# Patient Record
Sex: Female | Born: 1980 | Race: White | Hispanic: No | Marital: Single | Smoking: Current every day smoker
Health system: Southern US, Community
[De-identification: ages and names within clinical notes are randomized; demographics above are authoritative.]

## PROBLEM LIST (undated history)

## (undated) DIAGNOSIS — O42919 Preterm premature rupture of membranes, unspecified as to length of time between rupture and onset of labor, unspecified trimester: Secondary | ICD-10-CM

## (undated) DIAGNOSIS — Z9889 Other specified postprocedural states: Secondary | ICD-10-CM

## (undated) DIAGNOSIS — E039 Hypothyroidism, unspecified: Secondary | ICD-10-CM

## (undated) HISTORY — PX: GASTRIC BYPASS: SHX52

## (undated) HISTORY — PX: EYE SURGERY: SHX253

---

## 2007-01-21 ENCOUNTER — Emergency Department (HOSPITAL_COMMUNITY): Admission: EM | Admit: 2007-01-21 | Discharge: 2007-01-22 | Payer: Self-pay | Admitting: Emergency Medicine

## 2008-02-07 ENCOUNTER — Emergency Department (HOSPITAL_COMMUNITY): Admission: EM | Admit: 2008-02-07 | Discharge: 2008-02-07 | Payer: Self-pay | Admitting: Emergency Medicine

## 2008-08-11 ENCOUNTER — Emergency Department (HOSPITAL_COMMUNITY): Admission: EM | Admit: 2008-08-11 | Discharge: 2008-08-12 | Payer: Self-pay | Admitting: Emergency Medicine

## 2010-01-05 IMAGING — CT CT ABDOMEN W/O CM
2 of 4 series · 14 of 32 positions shown, 19 images · non-contrast
Comparison: None available.

CT ABDOMEN

CLINICAL DATA: Abdominal pain.

CT ABDOMEN AND PELVIS WITHOUT CONTRAST
TECHNIQUE: Multidetector CT imaging of the abdomen and pelvis was
performed following the standard protocol without intravenous
contrast.

[Series 2: routine abdomen · axial · 0.80mm/px · z∈[-418,-72]mm · 7 of 93 slices shown, 12 images]
[im 12/93  soft-tissue]
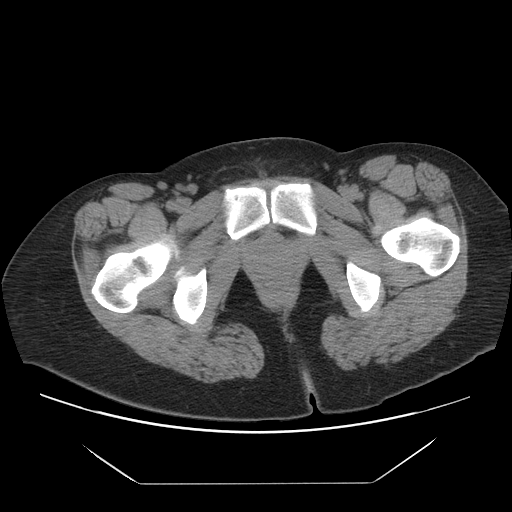
[im 12/93  bone]
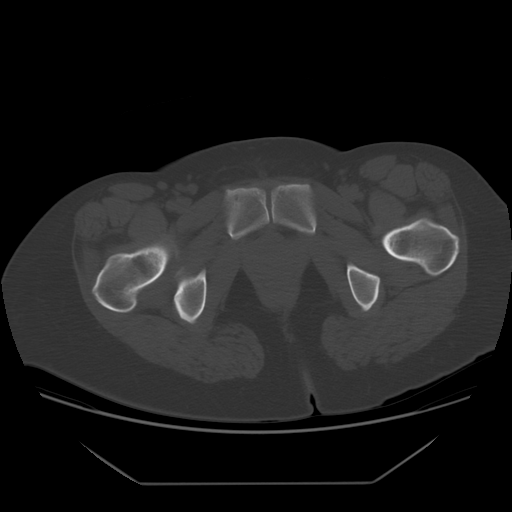
[im 24/93  soft-tissue]
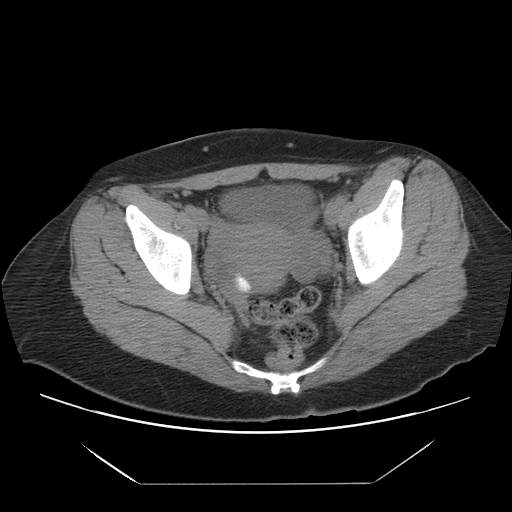
[im 35/93  soft-tissue]
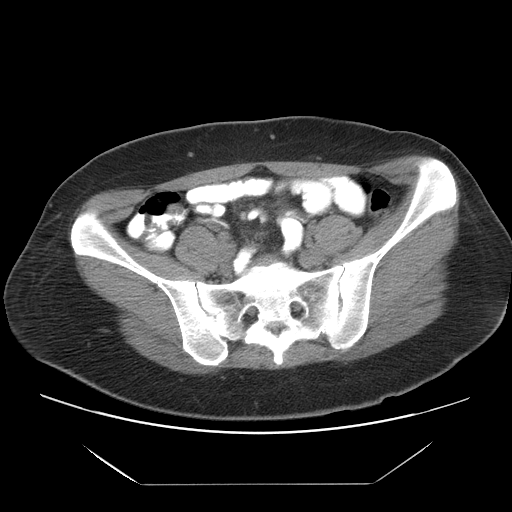
[im 47/93  soft-tissue]
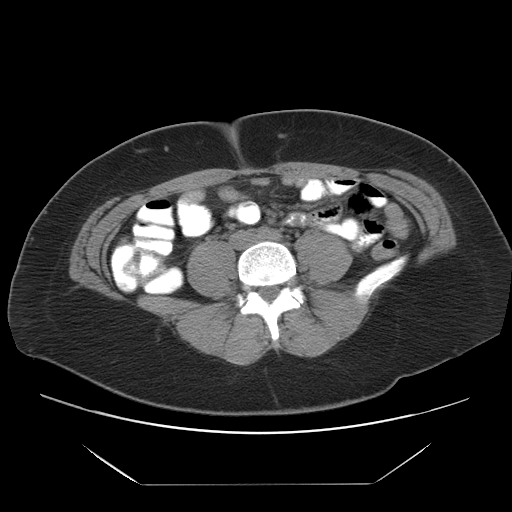
[im 47/93  lung]
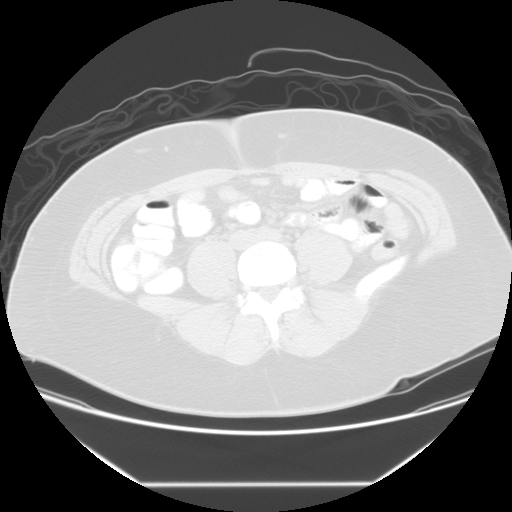
[im 58/93  soft-tissue]
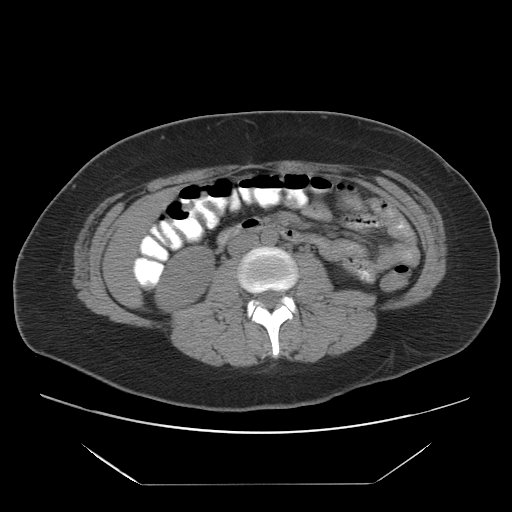
[im 58/93  lung]
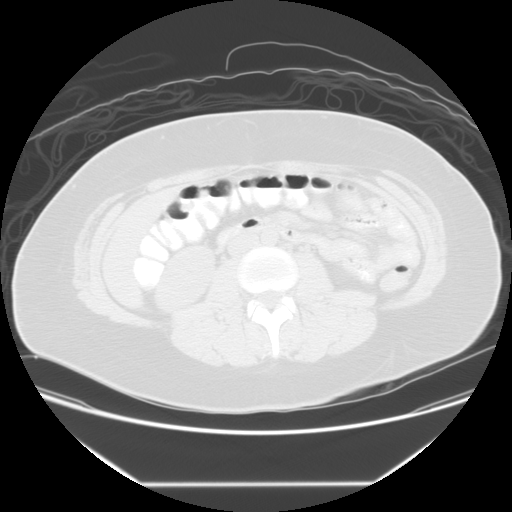
[im 70/93  soft-tissue]
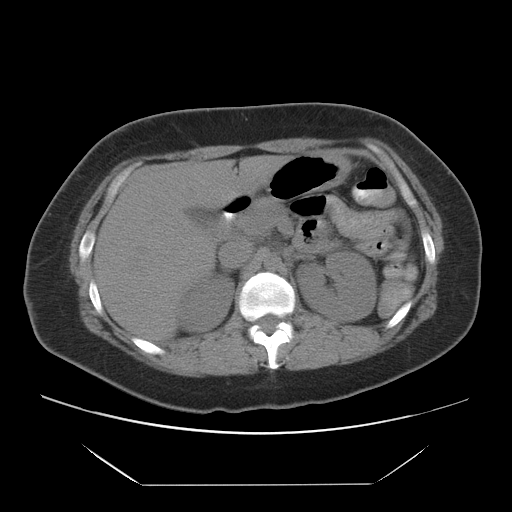
[im 70/93  lung]
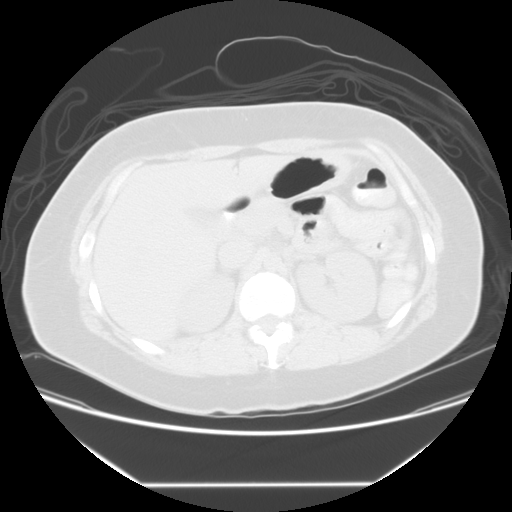
[im 81/93  soft-tissue]
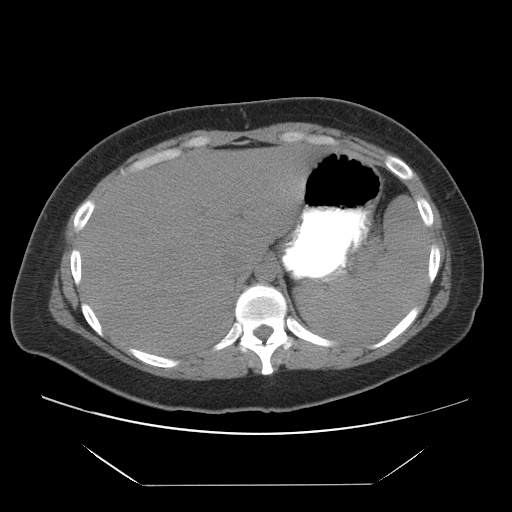
[im 81/93  lung]
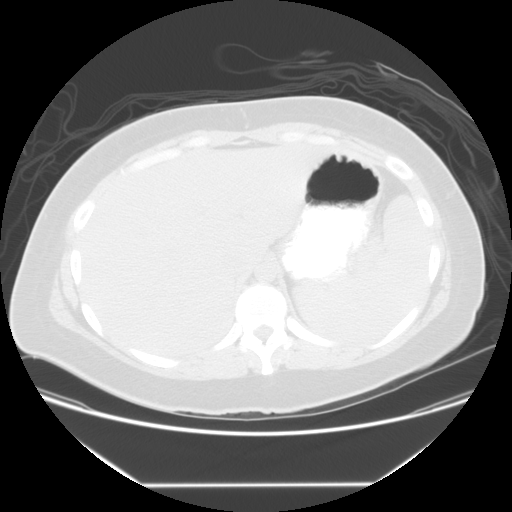

[Series 400: sag · sagittal · 0.90mm/px · 7 of 115 slices shown]
[im 12/115  soft-tissue]
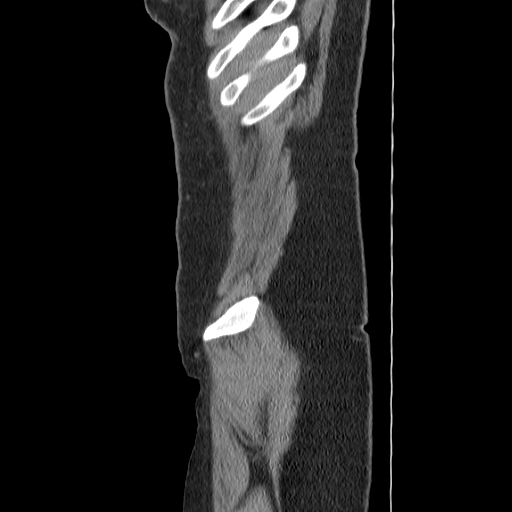
[im 23/115  soft-tissue]
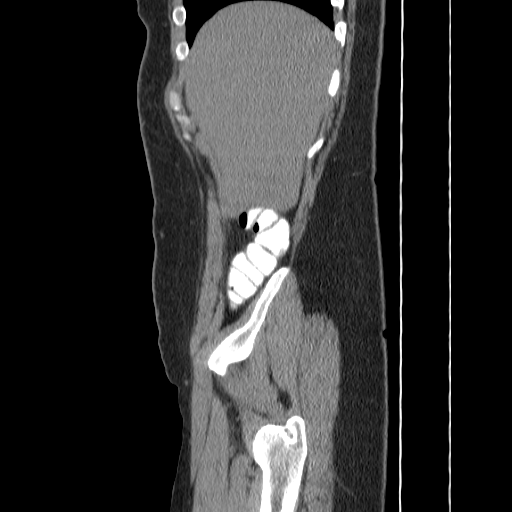
[im 35/115  soft-tissue]
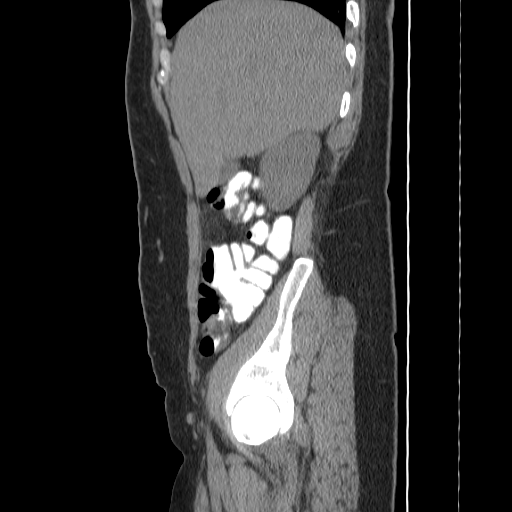
[im 46/115  soft-tissue]
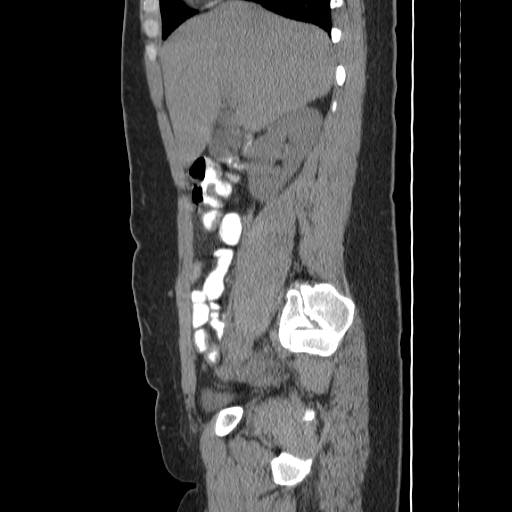
[im 69/115  soft-tissue]
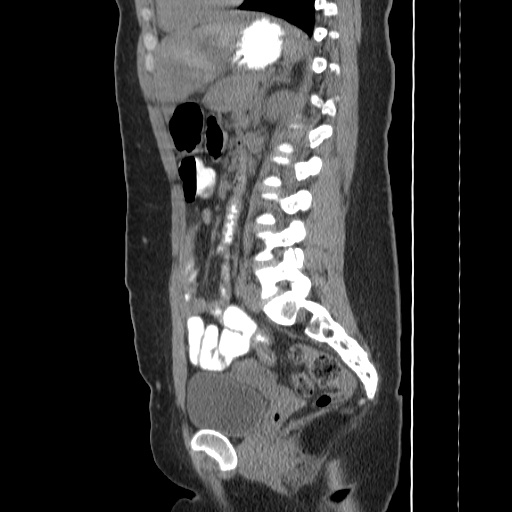
[im 80/115  soft-tissue]
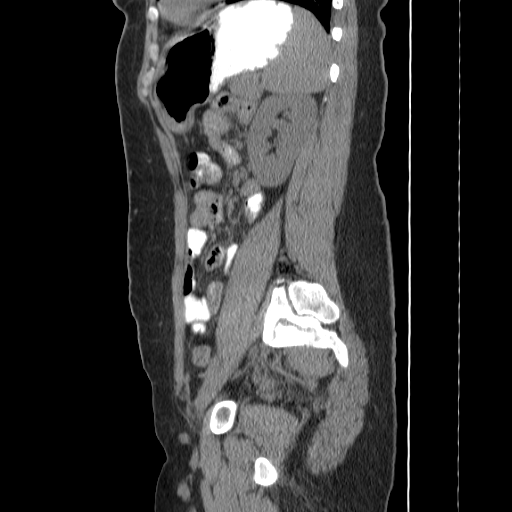
[im 92/115  soft-tissue]
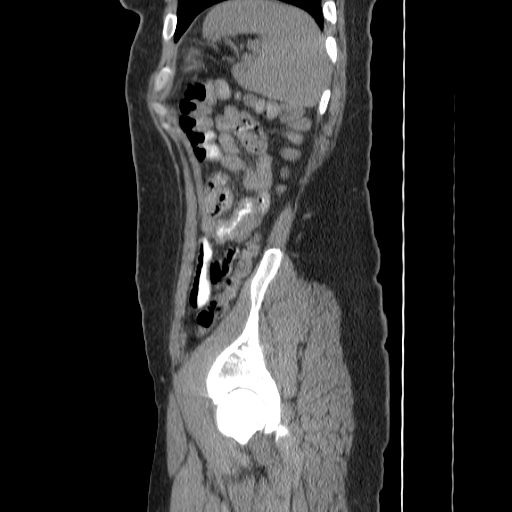

[14 of 32 positions shown; findings below may reference images not displayed]

FINDINGS: The lung bases are clear.  There is no pleural or
pericardial effusion.  The gallbladder, liver, spleen, pancreas,
kidneys and adrenal glands all have a normal uninfused appearance.
There is no abdominal lymphadenopathy or fluid.  Stomach and small
bowel appear normal.  There is no focal bony abnormality.
IMPRESSION: Negative abdomen CT scan.

CT PELVIS
FINDINGS: Uterus and adnexa appear normal.  No pelvic fluid or
lymphadenopathy.  The colon appears normal.  Appendix is visualized
and unremarkable.  No focal bony abnormality.
IMPRESSION: Negative pelvic CT.

## 2010-04-02 ENCOUNTER — Emergency Department (HOSPITAL_COMMUNITY): Admission: EM | Admit: 2010-04-02 | Discharge: 2010-04-02 | Payer: Self-pay | Admitting: Emergency Medicine

## 2010-11-02 LAB — URINALYSIS, ROUTINE W REFLEX MICROSCOPIC
Bilirubin Urine: NEGATIVE
Glucose, UA: NEGATIVE mg/dL
Hgb urine dipstick: NEGATIVE
Ketones, ur: NEGATIVE mg/dL
Protein, ur: NEGATIVE mg/dL
Urobilinogen, UA: 1 mg/dL (ref 0.0–1.0)

## 2010-11-02 LAB — PREGNANCY, URINE: Preg Test, Ur: NEGATIVE

## 2011-05-24 LAB — DIFFERENTIAL
Basophils Relative: 1 % (ref 0–1)
Eosinophils Absolute: 0 10*3/uL (ref 0.0–0.7)
Eosinophils Relative: 0 % (ref 0–5)
Lymphocytes Relative: 4 % — ABNORMAL LOW (ref 12–46)
Lymphs Abs: 0.9 10*3/uL (ref 0.7–4.0)
Monocytes Absolute: 0.8 10*3/uL (ref 0.1–1.0)
Neutro Abs: 22.1 10*3/uL — ABNORMAL HIGH (ref 1.7–7.7)

## 2011-05-24 LAB — WET PREP, GENITAL
Trich, Wet Prep: NONE SEEN
Yeast Wet Prep HPF POC: NONE SEEN

## 2011-05-24 LAB — URINE CULTURE: Colony Count: >=100000

## 2011-05-24 LAB — CBC
HCT: 45.1 % (ref 36.0–46.0)
Hemoglobin: 15.2 g/dL — ABNORMAL HIGH (ref 12.0–15.0)
WBC: 23.9 10*3/uL — ABNORMAL HIGH (ref 4.0–10.5)

## 2011-05-24 LAB — HEPATIC FUNCTION PANEL
Albumin: 3.7 g/dL (ref 3.5–5.2)
Total Bilirubin: 2 mg/dL — ABNORMAL HIGH (ref 0.3–1.2)

## 2011-05-24 LAB — URINALYSIS, ROUTINE W REFLEX MICROSCOPIC
Ketones, ur: 15 mg/dL — AB
Nitrite: NEGATIVE
Protein, ur: 30 mg/dL — AB
pH: 7.5 (ref 5.0–8.0)

## 2011-05-24 LAB — POCT I-STAT, CHEM 8
HCT: 51 % — ABNORMAL HIGH (ref 36.0–46.0)
Hemoglobin: 17.3 g/dL — ABNORMAL HIGH (ref 12.0–15.0)
Potassium: 4.5 mEq/L (ref 3.5–5.1)
Sodium: 138 mEq/L (ref 135–145)

## 2011-05-24 LAB — LIPASE, BLOOD: Lipase: 19 U/L (ref 11–59)

## 2011-05-24 LAB — URINE MICROSCOPIC-ADD ON

## 2011-05-24 LAB — POCT PREGNANCY, URINE: Preg Test, Ur: NEGATIVE

## 2011-05-24 LAB — GC/CHLAMYDIA PROBE AMP, GENITAL: Chlamydia, DNA Probe: POSITIVE — AB

## 2011-06-06 LAB — GC/CHLAMYDIA PROBE AMP, GENITAL: Chlamydia, DNA Probe: NEGATIVE

## 2011-06-06 LAB — URINE MICROSCOPIC-ADD ON

## 2011-06-06 LAB — URINALYSIS, ROUTINE W REFLEX MICROSCOPIC
Bilirubin Urine: NEGATIVE
Hgb urine dipstick: NEGATIVE
Nitrite: NEGATIVE
Specific Gravity, Urine: 1.026
pH: 6

## 2011-06-06 LAB — WET PREP, GENITAL
WBC, Wet Prep HPF POC: NONE SEEN
Yeast Wet Prep HPF POC: NONE SEEN

## 2011-06-06 LAB — RPR: RPR Ser Ql: REACTIVE — AB

## 2013-12-31 ENCOUNTER — Other Ambulatory Visit (HOSPITAL_COMMUNITY)
Admission: RE | Admit: 2013-12-31 | Discharge: 2013-12-31 | Disposition: A | Discharge status: Home / Self Care | Payer: BC Managed Care – PPO | Source: Ambulatory Visit | Attending: Obstetrics & Gynecology | Admitting: Obstetrics & Gynecology

## 2013-12-31 ENCOUNTER — Other Ambulatory Visit: Payer: Self-pay | Admitting: Obstetrics & Gynecology

## 2013-12-31 DIAGNOSIS — R87810 Cervical high risk human papillomavirus (HPV) DNA test positive: Secondary | ICD-10-CM | POA: Insufficient documentation

## 2013-12-31 DIAGNOSIS — Z1151 Encounter for screening for human papillomavirus (HPV): Secondary | ICD-10-CM | POA: Insufficient documentation

## 2013-12-31 DIAGNOSIS — Z124 Encounter for screening for malignant neoplasm of cervix: Secondary | ICD-10-CM | POA: Insufficient documentation

## 2014-01-21 ENCOUNTER — Other Ambulatory Visit: Payer: Self-pay | Admitting: Obstetrics & Gynecology

## 2014-02-14 ENCOUNTER — Other Ambulatory Visit: Payer: Self-pay | Admitting: Obstetrics & Gynecology

## 2014-09-30 ENCOUNTER — Other Ambulatory Visit (HOSPITAL_COMMUNITY)
Admission: RE | Admit: 2014-09-30 | Discharge: 2014-09-30 | Disposition: A | Discharge status: Home / Self Care | Payer: Medicaid Other | Source: Ambulatory Visit | Attending: Obstetrics & Gynecology | Admitting: Obstetrics & Gynecology

## 2014-09-30 ENCOUNTER — Other Ambulatory Visit: Payer: Self-pay | Admitting: Obstetrics & Gynecology

## 2014-09-30 DIAGNOSIS — Z01419 Encounter for gynecological examination (general) (routine) without abnormal findings: Secondary | ICD-10-CM | POA: Diagnosis present

## 2014-09-30 DIAGNOSIS — Z113 Encounter for screening for infections with a predominantly sexual mode of transmission: Secondary | ICD-10-CM | POA: Diagnosis present

## 2014-09-30 LAB — OB RESULTS CONSOLE GC/CHLAMYDIA
Chlamydia: NEGATIVE
GC PROBE AMP, GENITAL: NEGATIVE

## 2014-10-03 LAB — CYTOLOGY - PAP

## 2014-10-07 LAB — OB RESULTS CONSOLE RUBELLA ANTIBODY, IGM: Rubella: IMMUNE

## 2014-10-07 LAB — OB RESULTS CONSOLE GC/CHLAMYDIA
Chlamydia: NEGATIVE
Gonorrhea: NEGATIVE

## 2014-10-07 LAB — OB RESULTS CONSOLE HEPATITIS B SURFACE ANTIGEN: Hepatitis B Surface Ag: NEGATIVE

## 2014-10-07 LAB — OB RESULTS CONSOLE RPR: RPR: NONREACTIVE

## 2015-01-26 ENCOUNTER — Inpatient Hospital Stay (HOSPITAL_COMMUNITY): Payer: Medicaid Other

## 2015-01-26 ENCOUNTER — Inpatient Hospital Stay (HOSPITAL_COMMUNITY)
Admission: AD | Admit: 2015-01-26 | Discharge: 2015-02-05 | DRG: 765 | Disposition: A | Discharge status: Home / Self Care | Payer: Medicaid Other | Source: Ambulatory Visit | Attending: Obstetrics & Gynecology | Admitting: Obstetrics & Gynecology

## 2015-01-26 ENCOUNTER — Encounter (HOSPITAL_COMMUNITY): Payer: Self-pay

## 2015-01-26 ENCOUNTER — Ambulatory Visit (HOSPITAL_COMMUNITY): Payer: Medicaid Other

## 2015-01-26 DIAGNOSIS — Z87891 Personal history of nicotine dependence: Secondary | ICD-10-CM | POA: Diagnosis not present

## 2015-01-26 DIAGNOSIS — O42913 Preterm premature rupture of membranes, unspecified as to length of time between rupture and onset of labor, third trimester: Secondary | ICD-10-CM | POA: Diagnosis not present

## 2015-01-26 DIAGNOSIS — O99214 Obesity complicating childbirth: Secondary | ICD-10-CM | POA: Diagnosis present

## 2015-01-26 DIAGNOSIS — O322XX0 Maternal care for transverse and oblique lie, not applicable or unspecified: Secondary | ICD-10-CM | POA: Diagnosis present

## 2015-01-26 DIAGNOSIS — E669 Obesity, unspecified: Secondary | ICD-10-CM | POA: Diagnosis present

## 2015-01-26 DIAGNOSIS — Z3A28 28 weeks gestation of pregnancy: Secondary | ICD-10-CM | POA: Diagnosis present

## 2015-01-26 DIAGNOSIS — Z882 Allergy status to sulfonamides status: Secondary | ICD-10-CM

## 2015-01-26 DIAGNOSIS — N879 Dysplasia of cervix uteri, unspecified: Secondary | ICD-10-CM | POA: Diagnosis present

## 2015-01-26 DIAGNOSIS — E039 Hypothyroidism, unspecified: Secondary | ICD-10-CM | POA: Diagnosis present

## 2015-01-26 DIAGNOSIS — Z6841 Body Mass Index (BMI) 40.0 and over, adult: Secondary | ICD-10-CM

## 2015-01-26 DIAGNOSIS — O42919 Preterm premature rupture of membranes, unspecified as to length of time between rupture and onset of labor, unspecified trimester: Secondary | ICD-10-CM | POA: Diagnosis present

## 2015-01-26 DIAGNOSIS — Z98891 History of uterine scar from previous surgery: Secondary | ICD-10-CM

## 2015-01-26 DIAGNOSIS — B9620 Unspecified Escherichia coli [E. coli] as the cause of diseases classified elsewhere: Secondary | ICD-10-CM | POA: Diagnosis not present

## 2015-01-26 DIAGNOSIS — O8620 Urinary tract infection following delivery, unspecified: Secondary | ICD-10-CM | POA: Diagnosis not present

## 2015-01-26 DIAGNOSIS — O99284 Endocrine, nutritional and metabolic diseases complicating childbirth: Secondary | ICD-10-CM | POA: Diagnosis present

## 2015-01-26 DIAGNOSIS — O09213 Supervision of pregnancy with history of pre-term labor, third trimester: Secondary | ICD-10-CM | POA: Diagnosis not present

## 2015-01-26 DIAGNOSIS — O4290 Premature rupture of membranes, unspecified as to length of time between rupture and onset of labor, unspecified weeks of gestation: Secondary | ICD-10-CM

## 2015-01-26 DIAGNOSIS — O9989 Other specified diseases and conditions complicating pregnancy, childbirth and the puerperium: Secondary | ICD-10-CM | POA: Diagnosis present

## 2015-01-26 HISTORY — DX: Other specified postprocedural states: Z98.890

## 2015-01-26 HISTORY — DX: Hypothyroidism, unspecified: E03.9

## 2015-01-26 LAB — CBC
HCT: 31.9 % — ABNORMAL LOW (ref 36.0–46.0)
Hemoglobin: 11.1 g/dL — ABNORMAL LOW (ref 12.0–15.0)
MCH: 29.4 pg (ref 26.0–34.0)
MCHC: 34.8 g/dL (ref 30.0–36.0)
MCV: 84.6 fL (ref 78.0–100.0)
PLATELETS: 170 10*3/uL (ref 150–400)
RBC: 3.77 MIL/uL — ABNORMAL LOW (ref 3.87–5.11)
RDW: 12.5 % (ref 11.5–15.5)
WBC: 10 10*3/uL (ref 4.0–10.5)

## 2015-01-26 LAB — ABO/RH: ABO/RH(D): AB POS

## 2015-01-26 LAB — OB RESULTS CONSOLE GBS: STREP GROUP B AG: NEGATIVE

## 2015-01-26 LAB — TYPE AND SCREEN
ABO/RH(D): AB POS
Antibody Screen: NEGATIVE

## 2015-01-26 LAB — GROUP B STREP BY PCR: Group B strep by PCR: NEGATIVE

## 2015-01-26 LAB — AMNISURE RUPTURE OF MEMBRANE (ROM) NOT AT ARMC: Amnisure ROM: POSITIVE

## 2015-01-26 LAB — POCT FERN TEST: POCT Fern Test: POSITIVE

## 2015-01-26 MED ORDER — CALCIUM CARBONATE ANTACID 500 MG PO CHEW
2.0000 | CHEWABLE_TABLET | ORAL | Status: DC | PRN
  Filled 2015-01-26: qty 2

## 2015-01-26 MED ORDER — ACETAMINOPHEN 325 MG PO TABS: 650.0000 mg | ORAL_TABLET | ORAL | Status: DC | PRN

## 2015-01-26 MED ORDER — AZITHROMYCIN 1 G PO PACK
1.0000 g | PACK | Freq: Once | ORAL | Status: AC
  Administered 2015-01-26: 1 g via ORAL
  Filled 2015-01-26: qty 1

## 2015-01-26 MED ORDER — DOCUSATE SODIUM 100 MG PO CAPS
100.0000 mg | ORAL_CAPSULE | Freq: Every day | ORAL | Status: DC
  Administered 2015-01-26 – 2015-02-02 (×8): 100 mg via ORAL
  Filled 2015-01-26 (×9): qty 1

## 2015-01-26 MED ORDER — BETAMETHASONE SOD PHOS & ACET 6 (3-3) MG/ML IJ SUSP: 12.0000 mg | INTRAMUSCULAR | Status: DC

## 2015-01-26 MED ORDER — LEVOTHYROXINE SODIUM 100 MCG PO TABS
100.0000 ug | ORAL_TABLET | Freq: Every day | ORAL | Status: DC
  Filled 2015-01-26: qty 1

## 2015-01-26 MED ORDER — BETAMETHASONE SOD PHOS & ACET 6 (3-3) MG/ML IJ SUSP
12.0000 mg | Freq: Once | INTRAMUSCULAR | Status: AC
  Administered 2015-01-27: 12 mg via INTRAMUSCULAR
  Filled 2015-01-26: qty 2

## 2015-01-26 MED ORDER — SODIUM CHLORIDE 0.9 % IV SOLN
2.0000 g | Freq: Four times a day (QID) | INTRAVENOUS | Status: AC
Start: 2015-01-26 — End: 2015-01-28
  Administered 2015-01-26 – 2015-01-28 (×8): 2 g via INTRAVENOUS
  Filled 2015-01-26 (×8): qty 2000

## 2015-01-26 MED ORDER — BETAMETHASONE SOD PHOS & ACET 6 (3-3) MG/ML IJ SUSP
12.0000 mg | Freq: Once | INTRAMUSCULAR | Status: AC
  Administered 2015-01-26: 12 mg via INTRAMUSCULAR
  Filled 2015-01-26: qty 2

## 2015-01-26 MED ORDER — SODIUM CHLORIDE 0.9 % IJ SOLN
3.0000 mL | INTRAMUSCULAR | Status: DC | PRN
  Administered 2015-01-26 – 2015-01-27 (×2): 3 mL via INTRAVENOUS
  Filled 2015-01-26: qty 3

## 2015-01-26 MED ORDER — PRENATAL MULTIVITAMIN CH
1.0000 | ORAL_TABLET | Freq: Every day | ORAL | Status: DC
  Administered 2015-01-26 – 2015-02-02 (×8): 1 via ORAL
  Filled 2015-01-26 (×9): qty 1

## 2015-01-26 MED ORDER — ZOLPIDEM TARTRATE 5 MG PO TABS: 5.0000 mg | ORAL_TABLET | Freq: Every evening | ORAL | Status: DC | PRN

## 2015-01-26 MED ORDER — AMOXICILLIN 500 MG PO CAPS
500.0000 mg | ORAL_CAPSULE | Freq: Three times a day (TID) | ORAL | Status: AC
Start: 2015-01-28 — End: 2015-02-01
  Administered 2015-01-28 – 2015-02-01 (×15): 500 mg via ORAL
  Filled 2015-01-26 (×15): qty 1

## 2015-01-26 MED ORDER — LEVOTHYROXINE SODIUM 100 MCG PO TABS: 100.0000 ug | ORAL_TABLET | Freq: Every day | ORAL | Status: DC

## 2015-01-26 MED ORDER — LEVOTHYROXINE SODIUM 100 MCG PO TABS
100.0000 ug | ORAL_TABLET | Freq: Every day | ORAL | Status: DC
  Administered 2015-01-26 – 2015-02-05 (×11): 100 ug via ORAL
  Filled 2015-01-26 (×12): qty 1

## 2015-01-26 NOTE — H&P (Signed)
Autumn Mclaughlin is a 34 y.o. female, [phone] @ [redacted]w[redacted]d dated by LMP confirmed by 9wk scan who presented to MAU SROM clear @ 0450 this am.  She reports no contractions, no vaginal bleeding, +FM.  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 She has never used smokeless tobacco. She reports that she does not drink alcohol or use illicit drugs.   Prenatal Transfer Tool  Maternal Diabetes: No Genetic Screening: Normal Maternal Ultrasounds/Referrals: Normal Fetal Ultrasounds or other Referrals:  None Maternal Substance Abuse:  No Significant Maternal Medications:  Meds include: Progesterone Other: Levothyroxine Significant Maternal Lab Results: None  ROS:  See HPI above, all other systems are negative  Allergies  Allergen Reactions  . Sulfa Antibiotics Other (See Comments)    O: BP 109/52 mmHg  Pulse 86  Temp(Src) 97.9 F (36.6 C) (Oral)  Resp 16  Ht 5\' 5"  (1.651 m)  Wt 119.75 kg (264 lb)  BMI 43.93 kg/m2  SpO2 97%  Psychiatric: Normal mood and affect. Her behavior is normal GEN: NAD Neuro: A&O x 3 Skin: warm & dry  CV: RRR  Lungs: CTAB Abd: soft, non-tender, no rebound, no guarding, no uterine tenderness Ext: no edema, no calf tenderness bilaterally  GU examination- performed by [employer] -confirmed rupture with +ferning  Dilation: 1 Effacement (%): Thick Station:  Ballotable  Fetal Exam:  Monitor Surveillance : Continuous Monitoring  Mode: Ultrasound.  NICHD: 150, moderate variability, no accels, no decels CTXs: Q nono  Prenatal labs: ABO, Rh:  AB positive Antibody:  negative Rubella:   immune RPR:   non-reactive HBsAg:   negative HIV:   negative GBS:  unknown Sickle cell/Hgb electrophoresis:  WNL Pap:  10/09/14-Pap neg GC:   Neg  Chlamydia: neg Genetic screenings:  Tetra negative Glucola:  113  A/P: 33yo [id number]@ [redacted]w[redacted]d admitted for PPROM  -FWB- Cat. I, plan for continuous monitoring x12hr, if remains stable, will change to q shift -PPROM @ 28wk  Latency antibiotics ordered  Pt to complete steroid course  MFM and NICU consult, ultrasound this am  Bedrest with bathroom priviledges  GBS pending -Maternal well being  PNV daily  Colace twice daily as needed  Tylenol as needed  Hypothyroidism- continue Levothyroxine daily -DVT prophylaxis: SCDs   DISPO: Continue with antepartum fetal surveillance, plan for delivery at 34wk or as clinically indicated.  Venus Standard, CNM, MSN 01/26/2015, 6:47 AM   ADDENDUM @ 0730: Pt seen this am and changes made within note.  Myna Hidalgo, DO [phone] (pager) [phone] (office)

## 2015-01-26 NOTE — Consult Note (Signed)
Expand All Collapse All  Autumn Mclaughlin is a 34 y.o. female, [phone] @ [redacted]w[redacted]d dated by LMP confirmed by 9wk scan who presented to MAU SROM clear @ 0450 this am. She reports no contractions, no vaginal bleeding, +FM.  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 She has never used smokeless tobacco. She reports that she does not drink alcohol or use illicit drugs.  ROS: See HPI above, all other systems are negative Allergies   Allergen  Reactions   .  Sulfa Antibiotics  Other (See Comments)    Prenatal labs: ABO, Rh:  AB positive Antibody:  negative Rubella:  immune RPR:   non-reactive HBsAg:   negative HIV:   negative GBS:  unknown Sickle cell/Hgb electrophoresis: WNL Pap: 10/09/14-Pap neg GC: Neg  Chlamydia: neg Genetic screenings: Tetra negative Glucola: 78  A/P: 34 year old [id number]@ 28 weeks 0 days gestation admitted for PPROM   Agree with providers plan as outlined.   Continue with antepartum fetal surveillance, plan for delivery at 34wk or as clinically indicated.  Questions appear answered to the patients satisfaction. Precautions for the above given. Spent greater than 1/2 of 20 minute visit face to face counseling and records review

## 2015-01-26 NOTE — MAU Note (Signed)
Water broke at 0450. Clear fluid.  Baby moving well. No bleeding.  Slight cramping.

## 2015-01-26 NOTE — MAU Provider Note (Signed)
  History     CSN: [id number]  Arrival date and time: 01/26/15 8329   First Provider Initiated Contact with Patient 01/26/15 [phone]      Chief Complaint  Patient presents with  . Labor Eval   HPI  Autumn Mclaughlin is a 34 y.o. G2P0101 at 28 weeks who presents today with ROM. She states that she had a gush of clear fluid  around 0450. She denies any vaginal bleeding or contractions at this time. She states that the baby has been moving normally. Her last child was born at 32 weeks after ROM at 29 weeks. She has been getting 17-P with this pregnancy. She denies any other complications with this pregnancy at this time.   Past Medical History  Diagnosis Date  . Preterm labor   . H/O cone biopsy of cervix   . Hypothyroidism     Past Surgical History  Procedure Laterality Date  . Eye surgery      History reviewed. No pertinent family history.  History  Substance Use Topics  . Smoking status: Former Games developer  . Smokeless tobacco: Never Used  . Alcohol Use: No    Allergies:  Allergies  Allergen Reactions  . Sulfa Antibiotics Other (See Comments)    Prescriptions prior to admission  Medication Sig Dispense Refill Last Dose  . levothyroxine (SYNTHROID, LEVOTHROID) 100 MCG tablet Take 100 mcg by mouth daily before breakfast.   01/25/2015 at Unknown time  . Prenatal Vit-Fe Fumarate-FA (PRENATAL MULTIVITAMIN) TABS tablet Take 1 tablet by mouth daily at 12 noon.   01/25/2015 at Unknown time  . PRESCRIPTION MEDICATION MCKENNA INJECTION ONCE A WEEK TO PREVENT PRETERM LABOR- PROGESTERONE   Past Week at Unknown time    Review of Systems  Constitutional: Negative for fever.  Gastrointestinal: Negative for nausea, vomiting, abdominal pain, diarrhea and constipation.  Genitourinary: Negative for dysuria, urgency and frequency.   Physical Exam   Height 5\' 5"  (1.651 m), weight 119.75 kg (264 lb).  Physical Exam  Nursing note and vitals reviewed. Constitutional: She is oriented to  person, place, and time. She appears well-developed and well-nourished. No distress.  Cardiovascular: Normal rate.   Respiratory: Effort normal.  GI: Soft. There is no tenderness. There is no rebound.  Genitourinary:  Large gush of clear fluid from the vagina with valsalva.   Neurological: She is alert and oriented to person, place, and time.  Skin: Skin is warm and dry.  Psychiatric: She has a normal mood and affect.   Cervix: 1 at ext os/closed at int os/thick/ballotable  FHT: 145, moderate with 10x10 accels, no decels Toco: no UCs  MAU Course  Procedures  MDM Crist Fat positive  0533: D/W Dr. Su Hilt, will give BMZ and collect amnisure and GBS PCR. Ok for SVE.   Assessment and Plan  PPROM BMZ X 2 Latency antiobiotics GBS PCR pending Admit to antenatal Expectant management   Tawnya Crook 01/26/2015, 5:42 AM

## 2015-01-27 MED ORDER — LACTATED RINGERS IV BOLUS (SEPSIS)
1000.0000 mL | Freq: Once | INTRAVENOUS | Status: AC
  Administered 2015-01-27: 1000 mL via INTRAVENOUS

## 2015-01-27 NOTE — Progress Notes (Signed)
Dr Charlotta Newton reviewed strip and gave order to take off the monitor.

## 2015-01-27 NOTE — Progress Notes (Signed)
Antepartum Note, HD #2  S: Pt resting comfortably in bed.  No contractions, no  VB, no further LOF, +FM.  No acute complaints.  O: BP 105/52 mmHg  Pulse 93  Temp(Src) 98.2 F (36.8 C) (Oral)  Resp 20  Ht 5\' 5"  (1.651 m)  Wt 119.75 kg (264 lb)  BMI 43.93 kg/m2  SpO2 97%  Psychiatric: Normal mood and affect. Her behavior is normal GEN: NAD Neuro: A&O x 3 Skin: warm & dry CV: RRR Lungs: CTAB Abd: soft, non-tender, no rebound, no guarding, no uterine tenderness Ext: no edema, no calf tenderness bilaterally  FHT: 150, moderate variability, + accels, occasional variable decel Toco: no contractions SVE: deferred  A/P: 05WP [id number]@ [redacted]w[redacted]d admitted for PPROM  -FWB- Cat. II- overall reassuring for gestational age, will continue to closely monitor.  Should variable decels become more frequent, would consider BPP -PPROM @ 28wk On course of latency antibiotics 2nd dose of betamethasone given today 6/10 @ 0612 today  Last US performed 01/26/15: vertex/anterior/EFW: 2#7oz (43%), growth to be performed every 4 wks- next growth to be scheduled for 1st wk of July Bedrest with bathroom priviledges  GBS negative -Maternal well being PNV daily Colace twice daily as needed Tylenol as needed Hypothyroidism- continue Levothyroxine daily -DVT prophylaxis: SCDs   DISPO: Continue with antepartum fetal surveillance, plan for delivery at 34wk or as clinically indicated.  Myna Hidalgo, DO [phone] (pager) [phone] (office)

## 2015-01-27 NOTE — Plan of Care (Signed)
Problem: Consults Goal: Birthing Suites Patient Information Press F2 to bring up selections list   Pt < [redacted] weeks EGA     

## 2015-01-27 NOTE — Clinical SW OB High Risk (Signed)
Clinical Social Work Antenatal   Clinical Social Worker:  Alphonzo Cruise, [place] Date/Time:  01/27/2015, 1:40 PM Gestational Age on Admission:  34 y.o. Admitting Diagnosis:  PPROM   Expected Delivery Date:  04/20/15  [facility] Environment  Home Address:  [address], [place], [place] [address]  Household Member/Support Name: Armen Pickup Relationship:  Significant Other Other Support:    Psychosocial Data  Information Source:  Patient Interview Resources:     Employment:  Patient works at [employer].  [name] works for [employer].   Medicaid [facility]): [employer]: N/A   Current Grade:   Homebound Arranged:    Other Resources:  Medicaid  Cultural/Environment Issues Impacting Care: None stated   Strengths/Weaknesses/Factors to Consider  Concerns Related to Hospitalization: Patient states she has been through this before.  She reports that she ruptured at 29 weeks with her first pregnancy and delivered at 32 weeks.  Her son is now 74.  Patient is concerned about not being able to work because she likes to know that all of her bills will be paid.  She believes, however, that [name] will be able to work extra and get everything figured out.  Patient does not appear to be stressed at this time.   Previous Pregnancies/Feelings Towards Pregnancy?  Concerns related to being/becoming a mother?: Patient seems happy about the pregnancy and states she is willing to do anything needed to keep her baby healthy.  She is hopeful that she will be able to keep her in utero as long as possible, even though that means a lengthy hospital stay.  Social Support ([name]? Who is/will be helping with baby/other kids?): [name] is patient's main support person.  Patient states her mother is supportive, but lives in [place].  Patient reports her 35 year old son lives with her mother in [place].  Couples Relationship (describe): Patient reports a good relationship with [name].   Recent Stressful Life Events  (life changes in past year?): None stated   Prenatal Care/Education/Home Preparations: Not discussed at this time.   Domestic Violence (of any type):  No If Yes to Domestic Violence, Describe/Action Plan:     Substance Use During Pregnancy: No   Follow-up Recommendations: CSW asked that patient call if she has any further questions or would like to process her feelings at any time.   Patient Advised/Response:  Patient was very appreciative of CSW's visit and states no further questions, needs or concerns at this time.   Other:    Clinical Assessment/Plan:  CSW received call from Antenatal staff stating that patient has requested to speak with CSW regarding possible financial assistance due to current medical situation.  CSW met with patient who inquired whether there is financial assistance due to the fact that she is on bed rest and out of work.  Patient states she has Physicist, medical and Medicaid.  CSW explained that she does not qualify for disability since this situation will not last longer than a year.  Patient stated understanding.  CSW explained that if she finds herself in the position of having a past due bill, she can apply for emergency financial assistance through [employer].  Otherwise, there is not financial assistance given her circumstances.  Patient asked about low income financial assistance through [facility]  CSW thinks she is referring to TANF/[employer] and explained that this is assistance for unemployed individuals.  Patient stated understanding and reports that her job will be there for her when she is able to return.  She  thanked CSW for the information and visit.  She states "it couldn't hurt to ask," to which CSW agreed.  Patient does not seem concerned about inability to pay bills at this point.

## 2015-01-27 NOTE — Progress Notes (Signed)
Spoke with Dr Charlotta Newton about EFM strip.  We will give patient a bolus and evaluate a little longer.  She reviewed the strip.

## 2015-01-28 MED ORDER — SODIUM CHLORIDE 0.9 % IJ SOLN: 3.0000 mL | INTRAMUSCULAR | Status: DC | PRN

## 2015-01-28 MED ORDER — SODIUM CHLORIDE 0.9 % IJ SOLN
3.0000 mL | Freq: Two times a day (BID) | INTRAMUSCULAR | Status: DC
  Administered 2015-01-28 – 2015-02-02 (×8): 3 mL via INTRAVENOUS

## 2015-01-28 NOTE — Progress Notes (Signed)
Antepartum Note, HD #3  S: Pt resting comfortably in bed. No contractions, no VB, clear leakage of fluid , +FM. No abdominal tenderness   O: Filed Vitals:   01/28/15 0405  BP: 101/52  Pulse: 91  Temp: 98.5 F (36.9 C)  Resp: 18    Psychiatric: Normal mood and affect. Her behavior is normal GEN: NAD Neuro: A&O x 3 Skin: warm & dry Abd: soft, non-tender, no rebound, no guarding, no uterine tenderness Ext: no edema, no calf tenderness bilaterally  FHT: from NST last night reviewed  Baseline 140, moderate variability, + accels, occasional variable decel Toco: no contractions SVE: deferred  A/P: 40BO [id number]@ [redacted]w[redacted]d admitted for PPROM  -FWB-   NST for this am pending. NST from last night reassuring for gestational age, will continue to closely monitor.  -PPROM @ 28wk On course of latency antibiotics Bethamethosone course completed. 01/27/2015 Last US performed 01/26/15: vertex/anterior/EFW: 2#7oz (43%), growth to be performed every 4 wks- next            growth to be scheduled for 1st wk of July Bedrest with bathroom priviledges  GBS negative -Maternal well being PNV daily Colace twice daily as needed Tylenol as needed Hypothyroidism- continue Levothyroxine daily -DVT prophylaxis: SCDs   DISPO: Continue with antepartum fetal surveillance, plan for delivery at 34wk or as clinically indicated.

## 2015-01-29 LAB — TYPE AND SCREEN
ABO/RH(D): AB POS
Antibody Screen: NEGATIVE

## 2015-01-29 NOTE — Consult Note (Signed)
Asked by Dr.Ozan to provide prenatal consultation for patient at risk for preterm delivery due to PPROM.  Mother is 33yo G4 P1 who is now 12 3/[redacted] weeks EGA, with pregnancy complicated by preterm labor (being treated with 17-OHP since 16 wks) and hypothyroidism (on Synthroid).  She was given BMZ on 6/9 and 6/10 and is being treated with amoxicillin.  Current plan is fetal/maternal surveillance, continue pregnancy until 34 wks unless otherwise indicated.  Discussed usual expectations for preterm infant at 28+ weeks gestation, including possible needs for DR resuscitation, respiratory support, IV access, and blood products.  Also presented risks of death or serious morbidity, including brain injury and lung disease.  Projected possible length of stay in NICU until 36 - [redacted] wks EGA.  Discussed advantages of feeding with mother's milk.  She plans to pump postnatally. Also discussed benefits of kangaroo care and parental involvement in care.  Her previous child (now 56 yo) was a 33 wk preterm infant (in [place]) and she was curious about changes in neonatal care since then.    Patient was attentive, had appropriate questions, and was appreciative of my input.  Thank you for the consultation.  Total time 25 minutes

## 2015-01-29 NOTE — Progress Notes (Signed)
Antepartum Note, HD #4  S: Pt resting comfortably in bed. No contractions, no VB, clear leakage of fluid , +FM. No abdominal tenderness   O: Filed Vitals:   01/29/15 0912  BP: 104/51  Pulse: 79  Temp: 98.6 F (37 C)  Resp: 20                     Psychiatric: Normal mood and affect. Her behavior is normal GEN: NAD Neuro: A&O x 3 Skin: warm & dry Abd: soft, non-tender, no rebound, no guarding, no uterine tenderness Ext: no edema, no calf tenderness bilaterally  FHT:  Baseline 140, moderate variability, + accels, occasional variable decel Toco: no contractions SVE: deferred  A/P: 32NM [id number]@ [redacted]w[redacted]d admitted for PPROM  -FWB- NST reassuring -PPROM @ 28wk On course of latency antibiotics Bethamethosone course completed. 01/27/2015 Last US performed 01/26/15: vertex/anterior/EFW: 2#7oz (43%), growth to be performed every 4 wks- nextgrowth to be scheduled for 1st wk of July Bedrest with bathroom priviledges  GBS negative -Maternal well being PNV daily Colace twice daily as needed Tylenol as needed Hypothyroidism- continue Levothyroxine daily -DVT prophylaxis: SCDs   DISPO: Continue with antepartum fetal surveillance, plan for delivery at 34wk or as clinically indicated.

## 2015-01-30 MED ORDER — LACTATED RINGERS IV BOLUS (SEPSIS)
500.0000 mL | Freq: Once | INTRAVENOUS | Status: AC
  Administered 2015-01-30: 500 mL via INTRAVENOUS

## 2015-01-30 NOTE — Progress Notes (Signed)
TC from pt's primary care nurse re: [facility]. ? Variables and ctxs -- no ctxs felt by pt. Strip reviewed and pt assessed. Abdomen soft, NT -- no ctxs palpated.  P: Prolonged monitoring 500 ml LR bolus Consult Dr. Burley Saver, CNM

## 2015-01-30 NOTE — Progress Notes (Signed)
Antepartum Note, HD #5  S: Pt resting comfortably in bed. No contractions, no VB, occasional leak of clear leakagem +FM. No F/C/CP/SOB.  O:BP 120/59 mmHg  Pulse 89  Temp(Src) 97.9 F (36.6 C) (Oral)  Resp 20  Ht 5\' 5"  (1.651 m)  Wt 119.75 kg (264 lb)  BMI 43.93 kg/m2  SpO2 97%  Psychiatric: Normal mood and affect. Her behavior is normal GEN: NAD Neuro: A&O x 3 Skin: warm & dry Abd: soft, non-tender, no rebound, no guarding, no uterine tenderness Ext: no edema, no calf tenderness bilaterally  FHT:  Baseline 150, moderate variability, + accels, no decels overnight Toco: no contractions SVE: deferred  A/P: 44LP [id number]@ [redacted]w[redacted]d admitted for PPROM  -FWB- NST reassuring -PPROM @ 28wk On course of latency antibiotics Bethamethosone course completed. 01/27/2015 Last US performed 01/26/15: vertex/anterior/EFW: 2#7oz (43%), growth to be performed every 4 wks- nextgrowth to be scheduled for 1st wk of July Bedrest with bathroom priviledges  GBS negative -Maternal well being PNV daily Colace twice daily as needed Tylenol as needed Hypothyroidism- continue Levothyroxine daily -DVT prophylaxis: SCDs   DISPO: Continue with antepartum fetal surveillance, plan for delivery at 34wk or as clinically indicated.     Myna Hidalgo, DO [phone] (pager) [phone] (office)

## 2015-01-30 NOTE — Progress Notes (Signed)
Initial Nutrition Assessment  DOCUMENTATION CODES:  Morbid obesity  INTERVENTION:  Snacks  NUTRITION DIAGNOSIS:  Increased nutrient needs related to  (pregnancy and fetal growth requirements) as evidenced by  ([redacted] weeks gestation).   GOAL:  Patient will meet greater than or equal to 90% of their needs, Weight gain   MONITOR:  Weight trends  REASON FOR ASSESSMENT:   (antenatal)    ASSESSMENT:  28 4/7 weeks with PROM. 7 lb weight gain. Pre-pregnancy weight stated by pt as 257 lbs. Goal weight gain 11-20 lbs. Appetite good, and diet is tolerated well. Provide pt with retail and snack menus  Height:  Ht Readings from Last 1 Encounters:  01/26/15 5\' 5"  (1.651 m)    Weight:  Wt Readings from Last 1 Encounters:  01/26/15 264 lb (119.75 kg)    Ideal Body Weight:  125 kg  Wt Readings from Last 10 Encounters:  01/26/15 264 lb (119.75 kg)    BMI:  Body mass index is 43.93 kg/(m^2).  Estimated Nutritional Needs:  Kcal:  2100-2300  Protein:  90-100 g  Fluid:  2.4 L  Diet Order:  Diet regular Room service appropriate?: Yes; Fluid consistency:: Thin  EDUCATION NEEDS:  No education needs identified at this time  No intake or output data in the 24 hours ending 01/30/15 1321   [facility] M.Odis Luster LDN Neonatal Nutrition Support Specialist/RD III Pager [phone]      Phone [phone]

## 2015-01-30 NOTE — Progress Notes (Signed)
FHT reviewed. Variables noted. Reassuring. Continue obs for now.  Dr. Stefano Gaul

## 2015-01-31 MED ORDER — LACTATED RINGERS IV SOLN
INTRAVENOUS | Status: DC
  Administered 2015-01-31 – 2015-02-02 (×5): via INTRAVENOUS

## 2015-01-31 MED ORDER — LACTATED RINGERS IV BOLUS (SEPSIS)
1000.0000 mL | Freq: Once | INTRAVENOUS | Status: AC
  Administered 2015-01-31: 1000 mL via INTRAVENOUS

## 2015-01-31 NOTE — Progress Notes (Signed)
Antepartum Note, HD #6  S: Pt resting comfortably in bed. Pt reports feeling "menstrual cramping" that appears to be coming more often, ~ or so.  No VB, occasional leak of clear leak, +FM. No F/C/CP/SOB.  O:BP 94/45 mmHg  Pulse 91  Temp(Src) 98.4 F (36.9 C) (Oral)  Resp 18  Ht 5\' 5"  (1.651 m)  Wt 119.75 kg (264 lb)  BMI 43.93 kg/m2  SpO2 97%  Psychiatric: Normal mood and affect. Her behavior is normal GEN: NAD Neuro: A&O x 3 Skin: warm & dry Abd: soft, non-tender, no rebound, no guarding, no uterine tenderness Ext: no edema, no calf tenderness bilaterally  FHT:  Baseline 150, moderate variability, + 15x15 accels, variable decels overnight ~ 6 total each to 60-80bpm for with spontaneous recovery Toco: irregular contractions/irritability q 7-51min SVE: deferred  A/P: 42JW [id number]@ [redacted]w[redacted]d admitted for PPROM  -FWB- Cat. II- pt to be repositioned, will give IV fluid bolus and continue IV LR @ 125cc/hr.  Suspect decels due to anhydramnios.  Will plan for continuous monitoring -PPROM @ 28wk On course of latency antibiotics Bethamethosone course completed. 01/27/2015 Last US performed 01/26/15: vertex/anterior/EFW: 2#7oz (43%), growth to be performed every 4 wks- nextgrowth to be scheduled for 1st wk of July Bedrest with bathroom priviledges  GBS negative -Maternal well being PNV daily Colace twice daily as needed Tylenol as needed Hypothyroidism- continue Levothyroxine daily -DVT prophylaxis: SCDs   DISPO: Plan as outlined above, close observation due to decels and will plan for delivery at 34wk or as clinically indicated.     Myna Hidalgo, DO [phone] (pager) [phone] (office)

## 2015-02-01 LAB — TYPE AND SCREEN
ABO/RH(D): AB POS
Antibody Screen: NEGATIVE

## 2015-02-01 NOTE — Progress Notes (Signed)
Antepartum Note, HD #7  S: Pt resting comfortably in bed. Pt states that the cramping has improved.  No VB, occasional leak of clear fluid, +FM. No F/C/CP/SOB.  No acute complaints.  O:BP 106/51 mmHg  Pulse 83  Temp(Src) 98.6 F (37 C) (Oral)  Resp 17  Ht 5\' 5"  (1.651 m)  Wt 119.75 kg (264 lb)  BMI 43.93 kg/m2  SpO2 97%  Psychiatric: Normal mood and affect. Her behavior is normal GEN: NAD Neuro: A&O x 3 Skin: warm & dry CV: RRR Lungs: CTAB Abd: soft, non-tender, no rebound, no guarding, no uterine tenderness Ext: no edema, no calf tenderness bilaterally  FHT:  Baseline 150, moderate variability, + 10x10 accels, no decels Toco: no contractions SVE: deferred  A/P: 91TA [id number]@ [redacted]w[redacted]d admitted for PPROM  -FWB-FHT reassuring, occasional variables overnight, but improved from prior tracing.  Ok to return to q shift NST.  Continue with IV fluids, but decrease to 75cc/hr.  Possible discontinue tomorrow. Continue latency antibiotics Bethamethosone course completed. 01/27/2015 Last US performed 01/26/15: vertex/anterior/EFW: 2#7oz (43%), growth to be performed every 4 wks- nextgrowth to be scheduled for 1st wk of July Bedrest with bathroom priviledges, wheelchair rides ok GBS negative -Maternal well being PNV daily Colace twice daily as needed Tylenol as needed Hypothyroidism- continue Levothyroxine daily -DVT prophylaxis: SCDs   DISPO: Plan as outlined above and continued in-house observation, will plan for delivery at 34wk or as clinically indicated.     Myna Hidalgo, DO [phone] (pager) [phone] (office)

## 2015-02-02 ENCOUNTER — Inpatient Hospital Stay (HOSPITAL_COMMUNITY): Payer: Medicaid Other | Admitting: Registered Nurse

## 2015-02-02 ENCOUNTER — Encounter (HOSPITAL_COMMUNITY)
Admission: AD | Disposition: A | Discharge status: Home / Self Care | Payer: Self-pay | Source: Ambulatory Visit | Attending: Obstetrics & Gynecology

## 2015-02-02 ENCOUNTER — Encounter (HOSPITAL_COMMUNITY): Payer: Self-pay | Admitting: Registered Nurse

## 2015-02-02 SURGERY — Surgical Case
Anesthesia: Spinal

## 2015-02-02 MED ORDER — SCOPOLAMINE 1 MG/3DAYS TD PT72: 1.0000 | MEDICATED_PATCH | Freq: Once | TRANSDERMAL | Status: DC

## 2015-02-02 MED ORDER — MENTHOL 3 MG MT LOZG: 1.0000 | LOZENGE | OROMUCOSAL | Status: DC | PRN

## 2015-02-02 MED ORDER — NALBUPHINE HCL 10 MG/ML IJ SOLN: 5.0000 mg | INTRAMUSCULAR | Status: DC | PRN

## 2015-02-02 MED ORDER — DIBUCAINE 1 % RE OINT: 1.0000 "application " | TOPICAL_OINTMENT | RECTAL | Status: DC | PRN

## 2015-02-02 MED ORDER — NALOXONE HCL 0.4 MG/ML IJ SOLN: 0.4000 mg | INTRAMUSCULAR | Status: DC | PRN

## 2015-02-02 MED ORDER — SENNOSIDES-DOCUSATE SODIUM 8.6-50 MG PO TABS
2.0000 | ORAL_TABLET | ORAL | Status: DC
Start: 2015-02-03 — End: 2015-02-05
  Administered 2015-02-02 – 2015-02-04 (×3): 2 via ORAL
  Filled 2015-02-02 (×3): qty 2

## 2015-02-02 MED ORDER — LACTATED RINGERS IV BOLUS (SEPSIS)
500.0000 mL | Freq: Once | INTRAVENOUS | Status: AC
  Administered 2015-02-02: 500 mL via INTRAVENOUS

## 2015-02-02 MED ORDER — CITRIC ACID-SODIUM CITRATE 334-500 MG/5ML PO SOLN
ORAL | Status: AC
  Administered 2015-02-02: 30 mL
  Filled 2015-02-02: qty 15

## 2015-02-02 MED ORDER — SIMETHICONE 80 MG PO CHEW
80.0000 mg | CHEWABLE_TABLET | ORAL | Status: DC
Start: 2015-02-03 — End: 2015-02-05
  Administered 2015-02-02 – 2015-02-04 (×3): 80 mg via ORAL
  Filled 2015-02-02 (×3): qty 1

## 2015-02-02 MED ORDER — LACTATED RINGERS IV BOLUS (SEPSIS)
500.0000 mL | Freq: Once | INTRAVENOUS | Status: AC
  Administered 2015-02-02: 15:00:00 via INTRAVENOUS

## 2015-02-02 MED ORDER — ACETAMINOPHEN 325 MG PO TABS: 650.0000 mg | ORAL_TABLET | ORAL | Status: DC | PRN

## 2015-02-02 MED ORDER — DIPHENHYDRAMINE HCL 25 MG PO CAPS: 25.0000 mg | ORAL_CAPSULE | ORAL | Status: DC | PRN

## 2015-02-02 MED ORDER — NALBUPHINE HCL 10 MG/ML IJ SOLN: 5.0000 mg | Freq: Once | INTRAMUSCULAR | Status: AC | PRN

## 2015-02-02 MED ORDER — FENTANYL CITRATE (PF) 100 MCG/2ML IJ SOLN: 25.0000 ug | INTRAMUSCULAR | Status: DC | PRN

## 2015-02-02 MED ORDER — MEPERIDINE HCL 25 MG/ML IJ SOLN
INTRAMUSCULAR | Status: AC
  Filled 2015-02-02: qty 1

## 2015-02-02 MED ORDER — ONDANSETRON HCL 4 MG/2ML IJ SOLN
INTRAMUSCULAR | Status: AC
  Filled 2015-02-02: qty 2

## 2015-02-02 MED ORDER — DIPHENHYDRAMINE HCL 50 MG/ML IJ SOLN: 12.5000 mg | INTRAMUSCULAR | Status: DC | PRN

## 2015-02-02 MED ORDER — PHENYLEPHRINE 40 MCG/ML (10ML) SYRINGE FOR IV PUSH (FOR BLOOD PRESSURE SUPPORT)
PREFILLED_SYRINGE | INTRAVENOUS | Status: AC
  Filled 2015-02-02: qty 10

## 2015-02-02 MED ORDER — CEFAZOLIN SODIUM-DEXTROSE 2-3 GM-% IV SOLR
INTRAVENOUS | Status: DC | PRN
  Administered 2015-02-02: 2 g via INTRAVENOUS

## 2015-02-02 MED ORDER — LACTATED RINGERS IV SOLN
INTRAVENOUS | Status: DC
  Administered 2015-02-02: 12:00:00 via INTRAVENOUS

## 2015-02-02 MED ORDER — LACTATED RINGERS IV SOLN
INTRAVENOUS | Status: DC | PRN
  Administered 2015-02-02: 12:00:00 via INTRAVENOUS

## 2015-02-02 MED ORDER — PHENYLEPHRINE 8 MG IN D5W 100 ML (0.08MG/ML) PREMIX OPTIME
INJECTION | INTRAVENOUS | Status: DC | PRN
  Administered 2015-02-02: 60 ug/min via INTRAVENOUS

## 2015-02-02 MED ORDER — CEFAZOLIN SODIUM-DEXTROSE 2-3 GM-% IV SOLR: 2.0000 g | INTRAVENOUS | Status: DC

## 2015-02-02 MED ORDER — SODIUM CHLORIDE 0.9 % IJ SOLN: 3.0000 mL | INTRAMUSCULAR | Status: DC | PRN

## 2015-02-02 MED ORDER — NALOXONE HCL 1 MG/ML IJ SOLN: 1.0000 ug/kg/h | INTRAVENOUS | Status: DC | PRN

## 2015-02-02 MED ORDER — MORPHINE SULFATE (PF) 0.5 MG/ML IJ SOLN
INTRAMUSCULAR | Status: DC | PRN
  Administered 2015-02-02: .2 mg via INTRATHECAL

## 2015-02-02 MED ORDER — KETOROLAC TROMETHAMINE 30 MG/ML IJ SOLN: 30.0000 mg | Freq: Four times a day (QID) | INTRAMUSCULAR | Status: AC | PRN

## 2015-02-02 MED ORDER — ONDANSETRON HCL 4 MG/2ML IJ SOLN: 4.0000 mg | Freq: Once | INTRAMUSCULAR | Status: DC | PRN

## 2015-02-02 MED ORDER — ZOLPIDEM TARTRATE 5 MG PO TABS: 5.0000 mg | ORAL_TABLET | Freq: Every evening | ORAL | Status: DC | PRN

## 2015-02-02 MED ORDER — ONDANSETRON HCL 4 MG/2ML IJ SOLN: 4.0000 mg | Freq: Three times a day (TID) | INTRAMUSCULAR | Status: DC | PRN

## 2015-02-02 MED ORDER — ACETAMINOPHEN 10 MG/ML IV SOLN
1000.0000 mg | Freq: Once | INTRAVENOUS | Status: AC
  Administered 2015-02-02: 1000 mg via INTRAVENOUS
  Filled 2015-02-02: qty 100

## 2015-02-02 MED ORDER — MORPHINE SULFATE 0.5 MG/ML IJ SOLN
INTRAMUSCULAR | Status: AC
  Filled 2015-02-02: qty 10

## 2015-02-02 MED ORDER — SIMETHICONE 80 MG PO CHEW
80.0000 mg | CHEWABLE_TABLET | Freq: Three times a day (TID) | ORAL | Status: DC
Start: 2015-02-02 — End: 2015-02-05
  Administered 2015-02-03 – 2015-02-05 (×5): 80 mg via ORAL
  Filled 2015-02-02 (×6): qty 1

## 2015-02-02 MED ORDER — PRENATAL MULTIVITAMIN CH
1.0000 | ORAL_TABLET | Freq: Every day | ORAL | Status: DC
Start: 2015-02-03 — End: 2015-02-05
  Administered 2015-02-03 – 2015-02-05 (×3): 1 via ORAL
  Filled 2015-02-02 (×3): qty 1

## 2015-02-02 MED ORDER — TETANUS-DIPHTH-ACELL PERTUSSIS 5-2.5-18.5 LF-MCG/0.5 IM SUSP: 0.5000 mL | Freq: Once | INTRAMUSCULAR | Status: DC

## 2015-02-02 MED ORDER — CEFAZOLIN SODIUM-DEXTROSE 2-3 GM-% IV SOLR
INTRAVENOUS | Status: AC
  Filled 2015-02-02: qty 50

## 2015-02-02 MED ORDER — FENTANYL CITRATE (PF) 100 MCG/2ML IJ SOLN
INTRAMUSCULAR | Status: AC
  Filled 2015-02-02: qty 2

## 2015-02-02 MED ORDER — PHENYLEPHRINE 8 MG IN D5W 100 ML (0.08MG/ML) PREMIX OPTIME
INJECTION | INTRAVENOUS | Status: AC
  Filled 2015-02-02: qty 100

## 2015-02-02 MED ORDER — KETOROLAC TROMETHAMINE 30 MG/ML IJ SOLN
INTRAMUSCULAR | Status: AC
  Filled 2015-02-02: qty 1

## 2015-02-02 MED ORDER — LACTATED RINGERS IV SOLN: INTRAVENOUS | Status: DC

## 2015-02-02 MED ORDER — SIMETHICONE 80 MG PO CHEW: 80.0000 mg | CHEWABLE_TABLET | ORAL | Status: DC | PRN

## 2015-02-02 MED ORDER — FENTANYL CITRATE (PF) 100 MCG/2ML IJ SOLN
INTRAMUSCULAR | Status: DC | PRN
  Administered 2015-02-02 (×2): 25 ug via INTRAVENOUS
  Administered 2015-02-02: 40 ug via INTRAVENOUS
  Administered 2015-02-02: 10 ug via INTRATHECAL

## 2015-02-02 MED ORDER — DIPHENHYDRAMINE HCL 25 MG PO CAPS: 25.0000 mg | ORAL_CAPSULE | Freq: Four times a day (QID) | ORAL | Status: DC | PRN

## 2015-02-02 MED ORDER — OXYTOCIN 10 UNIT/ML IJ SOLN
40.0000 [IU] | INTRAVENOUS | Status: DC | PRN
  Administered 2015-02-02: 40 [IU] via INTRAVENOUS

## 2015-02-02 MED ORDER — SODIUM CHLORIDE 0.9 % IR SOLN
Status: DC | PRN
  Administered 2015-02-02: 1000 mL

## 2015-02-02 MED ORDER — KETOROLAC TROMETHAMINE 30 MG/ML IJ SOLN
30.0000 mg | Freq: Four times a day (QID) | INTRAMUSCULAR | Status: AC | PRN
  Administered 2015-02-02: 30 mg via INTRAMUSCULAR

## 2015-02-02 MED ORDER — OXYTOCIN 40 UNITS IN LACTATED RINGERS INFUSION - SIMPLE MED: 62.5000 mL/h | INTRAVENOUS | Status: AC

## 2015-02-02 MED ORDER — IBUPROFEN 600 MG PO TABS
600.0000 mg | ORAL_TABLET | Freq: Four times a day (QID) | ORAL | Status: DC
Start: 2015-02-03 — End: 2015-02-05
  Administered 2015-02-02 – 2015-02-05 (×11): 600 mg via ORAL
  Filled 2015-02-02 (×11): qty 1

## 2015-02-02 MED ORDER — LANOLIN HYDROUS EX OINT: 1.0000 "application " | TOPICAL_OINTMENT | CUTANEOUS | Status: DC | PRN

## 2015-02-02 MED ORDER — ONDANSETRON HCL 4 MG/2ML IJ SOLN
INTRAMUSCULAR | Status: DC | PRN
  Administered 2015-02-02: 4 mg via INTRAVENOUS

## 2015-02-02 MED ORDER — OXYCODONE-ACETAMINOPHEN 5-325 MG PO TABS
1.0000 | ORAL_TABLET | ORAL | Status: DC | PRN
  Administered 2015-02-03 – 2015-02-05 (×3): 1 via ORAL
  Filled 2015-02-02 (×6): qty 1

## 2015-02-02 MED ORDER — MEPERIDINE HCL 25 MG/ML IJ SOLN
6.2500 mg | INTRAMUSCULAR | Status: DC | PRN
  Administered 2015-02-02: 6.25 mg via INTRAVENOUS

## 2015-02-02 MED ORDER — OXYTOCIN 10 UNIT/ML IJ SOLN
INTRAMUSCULAR | Status: AC
  Filled 2015-02-02: qty 4

## 2015-02-02 MED ORDER — WITCH HAZEL-GLYCERIN EX PADS: 1.0000 "application " | MEDICATED_PAD | CUTANEOUS | Status: DC | PRN

## 2015-02-02 MED ORDER — OXYCODONE-ACETAMINOPHEN 5-325 MG PO TABS
2.0000 | ORAL_TABLET | ORAL | Status: DC | PRN
  Administered 2015-02-03 – 2015-02-05 (×8): 2 via ORAL
  Filled 2015-02-02 (×7): qty 2

## 2015-02-02 MED ORDER — PHENYLEPHRINE HCL 10 MG/ML IJ SOLN
INTRAMUSCULAR | Status: DC | PRN
  Administered 2015-02-02: 80 ug via INTRAVENOUS

## 2015-02-02 MED ORDER — BUPIVACAINE IN DEXTROSE 0.75-8.25 % IT SOLN
INTRATHECAL | Status: DC | PRN
Start: 2015-02-02 — End: 2015-02-02
  Administered 2015-02-02: 1.6 mL via INTRATHECAL

## 2015-02-02 SURGICAL SUPPLY — 41 items
APL SKNCLS STERI-STRIP NONHPOA (GAUZE/BANDAGES/DRESSINGS) ×1
BENZOIN TINCTURE PRP APPL 2/3 (GAUZE/BANDAGES/DRESSINGS) ×3 IMPLANT
CLAMP CORD UMBIL (MISCELLANEOUS) IMPLANT
CLOSURE WOUND 1/2 X4 (GAUZE/BANDAGES/DRESSINGS) ×1
CLOTH BEACON ORANGE TIMEOUT ST (SAFETY) ×3 IMPLANT
DRAPE SHEET LG 3/4 BI-LAMINATE (DRAPES) IMPLANT
DRSG OPSITE POSTOP 4X10 (GAUZE/BANDAGES/DRESSINGS) ×3 IMPLANT
DURAPREP 26ML APPLICATOR (WOUND CARE) ×3 IMPLANT
ELECT REM PT RETURN 9FT ADLT (ELECTROSURGICAL) ×3
ELECTRODE REM PT RTRN 9FT ADLT (ELECTROSURGICAL) ×1 IMPLANT
EXTRACTOR VACUUM KIWI (MISCELLANEOUS) IMPLANT
GLOVE BIO SURGEON STRL SZ 6.5 (GLOVE) ×1 IMPLANT
GLOVE BIO SURGEONS STRL SZ 6.5 (GLOVE) ×1
GLOVE BIOGEL PI IND STRL 6.5 (GLOVE) ×1 IMPLANT
GLOVE BIOGEL PI INDICATOR 6.5 (GLOVE) ×2
GLOVE ECLIPSE 6.5 STRL STRAW (GLOVE) ×3 IMPLANT
GOWN STRL REUS W/TWL LRG LVL3 (GOWN DISPOSABLE) ×6 IMPLANT
HEMOSTAT SURGICEL 2X3 (HEMOSTASIS) ×2 IMPLANT
KIT ABG SYR 3ML LUER SLIP (SYRINGE) IMPLANT
LIQUID BAND (GAUZE/BANDAGES/DRESSINGS) IMPLANT
NDL HYPO 25X5/8 SAFETYGLIDE (NEEDLE) IMPLANT
NEEDLE HYPO 25X5/8 SAFETYGLIDE (NEEDLE) IMPLANT
NS IRRIG 1000ML POUR BTL (IV SOLUTION) ×3 IMPLANT
PACK C SECTION WH (CUSTOM PROCEDURE TRAY) ×3 IMPLANT
PAD ABD 7.5X8 STRL (GAUZE/BANDAGES/DRESSINGS) ×3 IMPLANT
PAD OB MATERNITY 4.3X12.25 (PERSONAL CARE ITEMS) ×3 IMPLANT
RTRCTR C-SECT PINK 25CM LRG (MISCELLANEOUS) ×3 IMPLANT
STRIP CLOSURE SKIN 1/2X4 (GAUZE/BANDAGES/DRESSINGS) ×2 IMPLANT
SUT PLAIN 0 NONE (SUTURE) IMPLANT
SUT PLAIN 2 0 (SUTURE) ×3
SUT PLAIN 2 0 XLH (SUTURE) IMPLANT
SUT PLAIN ABS 2-0 CT1 27XMFL (SUTURE) IMPLANT
SUT VIC AB 0 CT1 27 (SUTURE) ×6
SUT VIC AB 0 CT1 27XBRD ANBCTR (SUTURE) ×2 IMPLANT
SUT VIC AB 0 CTX 36 (SUTURE) ×12
SUT VIC AB 0 CTX36XBRD ANBCTRL (SUTURE) ×3 IMPLANT
SUT VIC AB 2-0 CT1 27 (SUTURE) ×3
SUT VIC AB 2-0 CT1 TAPERPNT 27 (SUTURE) ×1 IMPLANT
SUT VIC AB 4-0 KS 27 (SUTURE) ×3 IMPLANT
TOWEL OR 17X24 6PK STRL BLUE (TOWEL DISPOSABLE) ×3 IMPLANT
TRAY FOLEY CATH SILVER 14FR (SET/KITS/TRAYS/PACK) IMPLANT

## 2015-02-02 NOTE — Progress Notes (Signed)
Antepartum Note, HD #8 S: Pt resting comfortably in bed. Pt states that the cramping has improved.  No VB, occasional leak of clear fluid, +FM. No F/C/CP/SOB.  Reporting slight dysuria, some increased frequency, however, she is also on IV fluids.  O:BP 111/58 mmHg  Pulse 94  Temp(Src) 98.7 F (37.1 C) (Oral)  Resp 18  Ht 5\' 5"  (1.651 m)  Wt 120.112 kg (264 lb 12.8 oz)  BMI 44.06 kg/m2  SpO2 98%  Psychiatric: Normal mood and affect. Her behavior is normal GEN: NAD Neuro: A&O x 3 Skin: warm & dry CV: RRR Lungs: CTAB Abd: soft, non-tender, no rebound, no guarding, no uterine tenderness, slight suprapubic tenderness Ext: no edema, no calf tenderness bilaterally  FHT:  Baseline 150, moderate variability, +15x15accels, occasional variable decels Toco: no contractions SVE: deferred  A/P: 93TT [id number]@ [redacted]w[redacted]d admitted for PPROM  -FWB-Occasional variable decels, overall reassuring.  Plan to continue NST q shift.  Ok to discontinue IV fluids and will continue to closely monitor -PPROM            s/p latency antibiotics Bethamethosone course completed. 01/27/2015 Last US performed 01/26/15: vertex/anterior/EFW: 2#7oz (43%), growth to be performed every 4 wks- nextgrowth to be scheduled for 1st wk of July Bedrest with bathroom priviledges, wheelchair rides ok GBS negative -Maternal well being PNV daily Colace twice daily as needed Tylenol as needed Hypothyroidism- continue Levothyroxine daily             Urine culture to be obtained due to dysuria.  Encouraged adequate po hydration -DVT prophylaxis: SCDs  DISPO: Plan as outlined above and continued in-house observation, will plan for delivery at 34wk or as clinically indicated.     Myna Hidalgo, DO [phone] (pager) [phone] (office)

## 2015-02-02 NOTE — Anesthesia Preprocedure Evaluation (Addendum)
Anesthesia Evaluation  Patient identified by MRN, date of birth, ID band Patient awake    Reviewed: Allergy & Precautions, NPO status , Patient's Chart, lab work & pertinent test resultsPreop documentation limited or incomplete due to emergent nature of procedure.  History of Anesthesia Complications Negative for: history of anesthetic complications  Airway Mallampati: III  TM Distance: >3 FB Neck ROM: Full    Dental no notable dental hx. (+) Dental Advisory Given   Pulmonary former smoker,  breath sounds clear to auscultation  Pulmonary exam normal       Cardiovascular negative cardio ROS Normal cardiovascular examRhythm:Regular Rate:Normal     Neuro/Psych negative neurological ROS  negative psych ROS   GI/Hepatic negative GI ROS, Neg liver ROS,   Endo/Other  Hypothyroidism Morbid obesity  Renal/GU negative Renal ROS  negative genitourinary   Musculoskeletal negative musculoskeletal ROS (+)   Abdominal (+) + obese,   Peds negative pediatric ROS (+)  Hematology negative hematology ROS (+)   Anesthesia Other Findings   Reproductive/Obstetrics (+) Pregnancy                            Anesthesia Physical Anesthesia Plan  ASA: III  Anesthesia Plan: Spinal   Post-op Pain Management:    Induction:   Airway Management Planned:   Additional Equipment:   Intra-op Plan:   Post-operative Plan:   Informed Consent: I have reviewed the patients History and Physical, chart, labs and discussed the procedure including the risks, benefits and alternatives for the proposed anesthesia with the patient or authorized representative who has indicated his/her understanding and acceptance.   Dental advisory given  Plan Discussed with: CRNA  Anesthesia Plan Comments: (Urgent C/S called by Dr. Nestor Ramp. Cat 3 trace, breech, contracting. No time to repeat labs. No history of bleeding disorders, no HTN  disorders, no fm hx. Will proceed with spinal anesthetic given morbid obesity, possible challenging airway, plt count adequate on 6/9)        Anesthesia Quick Evaluation

## 2015-02-02 NOTE — Progress Notes (Signed)
At bedside for evaluation:  S: Patient feeling increased pressure/cramping.  +bloody show  O: BP 114/62 mmHg  Pulse 100  Temp(Src) 98.2 F (36.8 C) (Oral)  Resp 20  Ht 5\' 5"  (1.651 m)  Wt 120.112 kg (264 lb 12.8 oz)  BMI 44.06 kg/m2  SpO2 98%  FHT: 150, moderate variability, +accels, variable decels to 90bpm with contractions Toco: q42min SSE: Cervix appears FT/long/high- no evidence of dilation, +blood-tinged fluid noted- no active bleeding SVE: 2/50/-3 BSUS: transverse presentation  A/P: A/P: 64EZ [id number]@ [redacted]w[redacted]d admitted for PPROM now in preterm labor -FWB-Cat. II tracing Due to worsening variable decelerations and fetal malpresentation, plan for primary C-section -PPROM  s/p latency antibiotics Bethamethosone course completed. 01/27/2015 GBS negative  Risk benefits and alternatives of cesarean section were discussed with the patient including but not limited to infection, bleeding, damage to bowel , bladder and baby with the need for further surgery. Pt voiced understanding and desires to proceed.   Myna Hidalgo, DO [phone] (pager) [phone] (office)

## 2015-02-02 NOTE — Transfer of Care (Signed)
Immediate Anesthesia Transfer of Care Note  Patient: Autumn Mclaughlin  Procedure(s) Performed: Procedure(s): CESAREAN SECTION (N/A)  Patient Location: PACU  Anesthesia Type:Spinal  Level of Consciousness: awake, alert , oriented and patient cooperative  Airway & Oxygen Therapy: Patient Spontanous Breathing  Post-op Assessment: Report given to RN and Post -op Vital signs reviewed and stable  Post vital signs: Reviewed and stable  Last Vitals:  Filed Vitals:   02/02/15 1137  BP:   Pulse: 88  Temp:   Resp:     Complications: No apparent anesthesia complications

## 2015-02-02 NOTE — Anesthesia Procedure Notes (Signed)
Spinal Patient location during procedure: OR Staffing Anesthesiologist: Braidyn Scorsone Performed by: anesthesiologist  Preanesthetic Checklist Completed: patient identified, site marked, surgical consent, pre-op evaluation, timeout performed, IV checked, risks and benefits discussed and monitors and equipment checked Spinal Block Patient position: sitting Prep: ChloraPrep Patient monitoring: continuous pulse ox, blood pressure and heart rate Approach: midline Location: L3-4 Injection technique: single-shot Needle Needle type: Sprotte  Needle gauge: 24 G Needle length: 9 cm Additional Notes Functioning IV was confirmed and monitors were applied. Sterile prep and drape, including hand hygiene, mask and sterile gloves were used. The patient was positioned and the spine was prepped. The skin was anesthetized with lidocaine.  Free flow of clear CSF was obtained prior to injecting local anesthetic into the CSF.  The spinal needle aspirated freely following injection.  The needle was carefully withdrawn.  The patient tolerated the procedure well. Consent was obtained prior to procedure with all questions answered and concerns addressed. Risks including but not limited to bleeding, infection, nerve damage, paralysis, failed block, inadequate analgesia, allergic reaction, high spinal, itching and headache were discussed and the patient wished to proceed.   Lark Runk, MD     

## 2015-02-02 NOTE — Progress Notes (Signed)
Postpartum Note Day # 0  S:  Patient resting comfortable in bed.  Pain controlled.  Tolerating sip/chips. No flatus, no BM.  Lochia moderate.  Foley in place, not yet ambulating or voiding freely. She denies n/v/f/c, SOB, or CP.    O: Temp:  [97.7 F (36.5 C)-99.6 F (37.6 C)] 98.9 F (37.2 C) (06/16 1755) Pulse Rate:  [66-120] 118 (06/16 1755) Resp:  [14-28] 18 (06/16 1755) BP: (94-147)/(38-92) 94/47 mmHg (06/16 1755) SpO2:  [89 %-99 %] 95 % (06/16 1755) Gen: A&Ox3, NAD CV: RRR, no MRG Resp: CTAB Abdomen: soft, NT, ND BS quiet Uterus: firm, non-tender, below umbilicus Incision:honeycomb dressing in place, appears C/D/I Ext: No edema, no calf tenderness bilaterally, SCDs in place  A/P: Pt is a 34 y.o. [id number] s/p primary C-section, POD#0  - Pain well controlled -GU: UOP is adequate, foley to be removed in am -GI: Advance diet as tolerated -Activity: encouraged sitting up to chair and ambulation as tolerated -Prophylaxis: SCDs in place, encourage early ambulation -Labs to be drawn in am -Baby girl in NICU -Hypothyroidism- continue with Levothyroxine daily  DISPO: Continue with routine postoperative care   Myna Hidalgo, DO [phone] (pager) [phone] (office)

## 2015-02-02 NOTE — Op Note (Signed)
PreOp Diagnosis: 1) Intrauterine pregnancy @[redacted]w[redacted]d  2) PPROM 3) preterm labor 4) Fetal malpresentation 5) Non-reassuring fetal heart tones PostOp Diagnosis: same Procedure: Primary C-section Surgeon: Dr. Myna Hidalgo Anesthesia: spinal Complications: none EBL: 700cc UOP: 200cc Fluids: 2300  Findings: Female infant from transverse presentation, normal uterus tubes and ovaries bilaterally  PROCEDURE:  Informed consent was obtained from the patient with risks, benefits, complications, treatment options, and expected outcomes discussed with the patient.  The patient concurred with the proposed plan, giving informed consent with form signed.   The patient was taken to Operating Room, and identified with the procedure verified as C-Section Delivery with Time Out. With induction of anesthesia, the patient was prepped and draped in the usual sterile fashion. A Pfannenstiel incision was made and carried down through the subcutaneous tissue to the fascia. The fascia was incised in the midline and extended transversely. The superior aspect of the fascial incision was grasped with Kochers elevated and the underlying muscle dissected off. The inferior aspect of the facial incision was in similar fashion, grasped elevated and rectus muscles dissected off. The peritoneum was identified and entered. Peritoneal incision was extended longitudinally. The Alexis retractor was placed.  The utero-vesical peritoneal reflection was identified and incised transversely with the [facility] scissors, the incision extended laterally, the bladder flap created digitally. A low transverse uterine incision was made.  Fetal arm noted to be present at hysterotomy.  Attempts were made to grasp the feet, with manipulation, sacrum was present at the hysterotomy.  Each leg was flexed at delivered then arms were rotated and delivered.  The head was flexed and fetal delivery was performed.  Delayed cord clamping was performed until gasp noted.  The  umbilical cord was clamped and cut cord blood was obtained for evaluation.   The placenta was removed intact and appeared normal. The uterine outline, tubes and ovaries appeared normal. The uterine incision was closed with running locked sutures of 0 Vicryl and a second layer of the same stitch was used in an imbricating fashion.  Similar stitch was used to obtain hemostasis and xcellent hemostasis was obtained.  The pericolic gutters were then cleared of all clots and debris. Surgicel was placed over the incision.  The peritoneum was re approximated with 2-0 vicryl.  The fascia was then reapproximated with running sutures of 0 Vicryl. The subcutaneous tissue was reapproximated with 2-0 plain gut suture.   The skin was closed with 4-0 vicryl in a subcuticular fashion.  Instrument, sponge, and needle counts were correct prior the abdominal closure and at the conclusion of the case. The patient was taken to recovery in stable condition.  Myna Hidalgo, DO [phone] (pager) [phone] (office)

## 2015-02-02 NOTE — Plan of Care (Signed)
Problem: Phase I Progression Outcomes Goal: Pain controlled with appropriate interventions Outcome: Not Applicable Date Met:  15/15/82 Pt. Denies any pain.  Problem: Phase II Progression Outcomes Goal: Electronic fetal monitoring(Doppler,Continuous,Intermittent) EFM (Doppler, Continuous, Intermittent)  Outcome: Progressing 30 min. NST QS.

## 2015-02-03 LAB — CBC
HEMATOCRIT: 29.6 % — AB (ref 36.0–46.0)
HEMOGLOBIN: 10.1 g/dL — AB (ref 12.0–15.0)
MCH: 29.1 pg (ref 26.0–34.0)
MCHC: 34.1 g/dL (ref 30.0–36.0)
MCV: 85.3 fL (ref 78.0–100.0)
Platelets: 140 10*3/uL — ABNORMAL LOW (ref 150–400)
RBC: 3.47 MIL/uL — ABNORMAL LOW (ref 3.87–5.11)
RDW: 13.1 % (ref 11.5–15.5)
WBC: 17.4 10*3/uL — ABNORMAL HIGH (ref 4.0–10.5)

## 2015-02-03 NOTE — Progress Notes (Signed)
Postpartum Note Day # 1  S:  Patient resting comfortable in bed.  Pain controlled.  Tolerating general diet. No flatus, no BM.  Lochia moderate.  Pt up to ambulate to restroom and has voided this am without difficulty.  She denies n/v/f/c, SOB, or CP.  Plans to breastfeed with small production  O: Temp:  [97.7 F (36.5 C)-100.4 F (38 C)] 100.4 F (38 C) (06/17 0547) Pulse Rate:  [88-120] 104 (06/17 0547) Resp:  [14-28] 18 (06/17 0547) BP: (79-147)/(38-92) 107/55 mmHg (06/17 0547) SpO2:  [89 %-99 %] 95 % (06/17 0547) Gen: A&Ox3, NAD CV: RRR, no MRG Resp: CTAB Abdomen: soft, NT, ND BS quiet Uterus: firm, non-tender, below umbilicus Incision:honeycomb dressing in place, appears C/D/I Ext: No edema, no calf tenderness bilaterally, SCDs in place  CBC Latest Ref Rng 02/03/2015 01/26/2015 08/11/2008  WBC 4.0 - 10.5 K/uL 17.4(H) 10.0 -  Hemoglobin 12.0 - 15.0 g/dL 10.1(L) 11.1(L) 17.3(H)  Hematocrit 36.0 - 46.0 % 29.6(L) 31.9(L) 51.0(H)  Platelets 150 - 400 K/uL 140(L) 170 -    A/P: Pt is a 34 y.o. [id number] s/p primary C-section, POD#1  -Low grade temp this am of 100.4- no evidence of infection, will continue to monitor and recheck temp this am.  Further management pending today's vitals. - Pain well controlled -GU: Voiding freely this am -GI: Advance diet as tolerated -Activity: encouraged sitting up to chair and ambulation as tolerated -Prophylaxis: SCDs in place, encourage early ambulation -Hypothyroidism- continue with Levothyroxine daily  DISPO: Continue with routine postoperative care   Myna Hidalgo, DO [phone] (pager) [phone] (office)

## 2015-02-03 NOTE — Lactation Note (Signed)
This note was copied from the chart of Autumn Mclaughlin. Lactation Consultation Note  Mom has been using a double electric breast pump and has abrasion on her nipples from friction.  Flange size was changed to a #36 to eliminate friction.  Comfort gels initiated to encourage healing.  Mom is concerned because she is not expressing much colostrum at this time.  Normal expectations were reviewed with her. Hand expression was taught with colostrum visible.  This encouraged her.  Hand expression 4-5 times daily along with every 3 hour pumping was recommended.  She plans to use donor milk from [facility] until her milk comes to volume.  Hand outs on support groups and outpatient services were given to her.  Patient Name: Autumn Mclaughlin [id number] Date: 02/03/2015     Maternal Data    Feeding Feeding Type: Breast Milk  LATCH Score/Interventions                      Lactation Tools Discussed/Used     Consult Status      Soyla Dryer 02/03/2015, 8:22 PM

## 2015-02-03 NOTE — Anesthesia Postprocedure Evaluation (Signed)
  Anesthesia Post-op Note  Patient: Autumn Mclaughlin  Procedure(s) Performed: Procedure(s) (LRB): CESAREAN SECTION (N/A)    Anesthesia Type: Spinal  Level of Consciousness: awake and alert   Airway and Oxygen Therapy: Patient Spontanous Breathing  Post-op Pain: mild  Post-op Assessment: Post-op Vital signs reviewed, Patient's Cardiovascular Status Stable, Respiratory Function Stable, Patent Airway and No signs of Nausea or vomiting   Post-op Vital Signs: stable. Tachycardia had improved with fluid bolus. HR 100 on discharge from PACU.   Complications: No apparent anesthesia complications

## 2015-02-03 NOTE — Clinical Social Work Maternal (Signed)
CLINICAL SOCIAL WORK MATERNAL/CHILD NOTE  Patient Details  Name: Autumn Mclaughlin MRN: [id number] Date of Birth: 07/03/1981  Date:  02/03/2015  Clinical Social Worker Initiating Note:  Autumn Wurtz E. Ashleyann Shoun, LCSW Date/ Time Initiated:  02/03/15/1230     Child's Name:  Autumn Mclaughlin   Legal Guardian:   (Parents: Autumn Mclaughlin and Autumn Mclaughlin)   Need for Interpreter:  None   Date of Referral:        Reason for Referral:   (No referral-NICU admission)   Referral Source:      Address:  [address] [place], [place] [address]  Phone number:  [phone]   Household Members:  Significant Other   Natural Supports (not living in the home):  Extended Family ([name] and [name] live in [place].  [name]'s family lives in [place].)   Professional Supports:     Employment:     Type of Work:  (MOB works at [employer].  [name] works for [employer])   Education:      Financial Resources:  Medicaid   Other Resources:  WIC   Cultural/Religious Considerations Which May Impact Care: None stated  Strengths:  Home prepared for child , Compliance with medical plan , Understanding of illness, Ability to meet basic needs , Other (Comment) (Parents aware that they can obtain a pediatrician list from the NICU RN station.)   Risk Factors/Current Problems:  None   Cognitive State:  Alert , Linear Thinking , Insightful    Mood/Affect:  Calm , Comfortable , Relaxed    CSW Assessment: CSW met with parents in [name] to complete assessment due to NICU admission at 29 weeks.  CSW has previously met MOB while a patient on the Antenatal Unit, but introduced to [name].  Parents were welcoming of CSW's visit.  [name] spoke when CSW directed a question at him, but otherwise was not engaged in the conversation.  MOB was pumping and reports feeling well.  She provided CSW with an update on baby and appears to be coping well.  She has previous experience with a preemie admitted to  NICU, as her son was born at 32 weeks.  [name] reports that he has 5 other children, 3 of whom are "out of school" and two who live with their mother.  MOB reports no hx of PPD after first delivery and was attentive to information given by CSW about signs and symptoms to watch for.  Parents report having all necessary supplies for infant at home.  They state they do not have a car.  [name] state he will travel by cab to visit baby, but MOB was appreciative and accepting of bus passes offered so that she may visit while [name] is at work.  She reports frequent use of the bus system for travel.  CSW inquired about the arrangement made for MOB's son to live in [place] with her mother.  MOB reports that they were all living together at one time and that [name]'s move to [place] is what brought MOB to this area.  She states [name] moved back to [place] and the decision was made for MOB's son to go back with her.  MOB states she visits [place] 5-6 times per year and that they have tickets to come here in September, when baby was due.  MOB states her son is well cared for by [name], however, it seems from her explanation that [name] was forceful about the arrangement.   Parents report no questions,   concerns or needs at this time.  CSW explained ongoing support services offered by NICU CSW and how to contact CSW.  31 day bus pass given to MOB.  CSW Plan/Description:  Patient/Family Education , Psychosocial Support and Ongoing Assessment of Needs    Autumn Frediani Elizabeth, LCSW 02/03/2015, 1:39 PM  

## 2015-02-04 ENCOUNTER — Encounter (HOSPITAL_COMMUNITY): Payer: Self-pay | Admitting: Obstetrics & Gynecology

## 2015-02-04 LAB — URINE CULTURE: Culture: >=100000

## 2015-02-04 LAB — RAPID HIV SCREEN (HIV 1/2 AB+AG)
HIV 1/2 Antibodies: NONREACTIVE
HIV-1 P24 Antigen - HIV24: NONREACTIVE

## 2015-02-04 LAB — RPR: RPR: NONREACTIVE

## 2015-02-04 MED ORDER — CEPHALEXIN 500 MG PO CAPS
500.0000 mg | ORAL_CAPSULE | Freq: Two times a day (BID) | ORAL | Status: DC
  Administered 2015-02-04 – 2015-02-05 (×2): 500 mg via ORAL
  Filled 2015-02-04 (×5): qty 1

## 2015-02-04 MED ORDER — POLYETHYLENE GLYCOL 3350 17 G PO PACK
17.0000 g | PACK | Freq: Every day | ORAL | Status: DC
  Administered 2015-02-04: 17 g via ORAL
  Filled 2015-02-04: qty 1

## 2015-02-04 NOTE — Anesthesia Postprocedure Evaluation (Signed)
  Anesthesia Post-op Note  Patient: Autumn Mclaughlin  Procedure(s) Performed: Procedure(s): CESAREAN SECTION (N/A)  Patient Location: [facility] Unit  Anesthesia Type:Spinal  Level of Consciousness: awake, alert  and oriented  Airway and Oxygen Therapy: Patient Spontanous Breathing  Post-op Pain: mild  Post-op Assessment: Patient's Cardiovascular Status Stable, Respiratory Function Stable, Patent Airway, No signs of Nausea or vomiting, Pain level controlled, No headache, No backache and Patient able to bend at knees LLE Motor Response: Purposeful movement LLE Sensation: Full sensation RLE Motor Response: Purposeful movement RLE Sensation: Full sensation      Post-op Vital Signs: Reviewed and stable  Last Vitals:  Filed Vitals:   02/04/15 0551  BP: 107/58  Pulse: 99  Temp: 36.6 C  Resp: 21    Complications: No apparent anesthesia complications

## 2015-02-04 NOTE — Lactation Note (Signed)
This note was copied from the chart of Autumn Allyanna Florek. Lactation Consultation Note  Patient Name: Autumn Mclaughlin [id number] Date: 02/04/2015 Reason for consult: Pump rental;NICU baby Baby 2 hours old. Mom requested [facility] loaner pump, and is planning to discharge tomorrow. Mom aware of NICU pumping rooms. Enc to ask for assistance as needed.   Maternal Data    Feeding Feeding Type: Other (comment) (EBM/DBM)  LATCH Score/Interventions                      Lactation Tools Discussed/Used Tools: Comfort gels;Pump Breast pump type: Double-Electric Breast Pump   Consult Status Consult Status: Follow-up Date: 02/05/15 Follow-up type: In-patient    Geralynn Ochs 02/04/2015, 8:09 PM

## 2015-02-04 NOTE — Lactation Note (Signed)
This note was copied from the chart of Autumn Mclaughlin. Lactation Consultation Note  Patient Name: Autumn Mclaughlin [id number] Date: 02/04/2015 Reason for consult: Follow-up assessment;NICU baby   Follow-up with mom at 6 hours old of NICU infant; GA 29.0; BW 2 lbs, 13.9 oz   LC yesterday increased mom's flanges to #36 for a better fit. Abrasions noted around edges of areola with some scabbing.  Mom has been wearing comfort gels. Mom is still getting just drops of milk when she pumps. Mom stated Medolac donor milk was given last night to infant for the first time.  Mom plans to use donor milk until her supply increases. Mom reports pumping every 3 hours around the clock and had already pumped 3 times since midnight.  Mom reports waking twice during night to pump. LC encouraged pumping every 2 hours during the day and at least once at night for a minimum of 8+ times per day. Taught mom how to do hands-on pumping with hand expression at end of pumping session to help maximize milk output.   Encouraged keeping the pumping log in the book; reviewed NICU booklet with mom. Mom has appointment with [facility] on Monday @10  am to pick up pump; LC completed paperwork and faxed to [facility] with appointment date mom stated. Mom is interested in [facility] Loaner; Paperwork given; mom to call [facility] when paperwork is complete to obtain Loaner today. Discharge anticipated for tomorrow (Sunday).    Maternal Data Has patient been taught Hand Expression?: Yes Does the patient have breastfeeding experience prior to this delivery?: Yes   Lactation Tools Discussed/Used WIC Program: Yes Pump Review: Setup, frequency, and cleaning;Milk Storage   Consult Status Consult Status: PRN (as needed in NICU)    Lendon Ka 02/04/2015, 12:21 PM

## 2015-02-04 NOTE — Addendum Note (Signed)
Addendum  created 02/04/15 0813 by Lincoln Brigham, CRNA   Modules edited: Notes Section   Notes Section:  File: [id number]

## 2015-02-04 NOTE — Progress Notes (Signed)
Autumn Mclaughlin [id number]  Subjective: Postpartum Day 2: Primary  LTC/S due to PPROM, PTL, malpresentation, and NRFHR Patient up ad lib, reports no syncope or dizziness. Feeding:  Pumping Contraceptive plan:  Undecided Feels need for BM, but has not been able to go yet.  Passing flatus. Has received stool softeners, Senakot, and simethicone. Declines suppository or enema.  Reports baby stable in NICU, off supplemental O2.   Objective: Temp:  [97.9 F (36.6 C)-99.7 F (37.6 C)] 97.9 F (36.6 C) (06/18 0551) Pulse Rate:  [99-118] 99 (06/18 0551) Resp:  [18-21] 21 (06/18 0551) BP: (107-135)/(58-67) 107/58 mmHg (06/18 0551) SpO2:  [99 %-100 %] 99 % (06/18 0551)  CBC Latest Ref Rng 02/03/2015 01/26/2015 08/11/2008  WBC 4.0 - 10.5 K/uL 17.4(H) 10.0 -  Hemoglobin 12.0 - 15.0 g/dL 10.1(L) 11.1(L) 17.3(H)  Hematocrit 36.0 - 46.0 % 29.6(L) 31.9(L) 51.0(H)  Platelets 150 - 400 K/uL 140(L) 170 -     Physical Exam:  General: alert Lochia: appropriate Uterine Fundus: firm Abdomen:  + bowel sounds, no BM yet, but "feels need to go". Incision: Honeycomb dressing CDI DVT Evaluation: No evidence of DVT seen on physical exam. Negative Homan's sign.   Assessment/Plan: Status post cesarean delivery, day 2. NICU infant Stable Continue current care. Miralax daily Encouraged fluids and ambulation. Plan for discharge tomorrow    Nigel Bridgeman MSN, CNM 02/04/2015, 10:47 AM

## 2015-02-05 ENCOUNTER — Telehealth: Payer: Self-pay | Admitting: Obstetrics and Gynecology

## 2015-02-05 DIAGNOSIS — Z98891 History of uterine scar from previous surgery: Secondary | ICD-10-CM

## 2015-02-05 MED ORDER — IBUPROFEN 600 MG PO TABS: 600.0000 mg | ORAL_TABLET | Freq: Four times a day (QID) | ORAL | Status: DC | PRN

## 2015-02-05 MED ORDER — CEPHALEXIN 500 MG PO CAPS: 500.0000 mg | ORAL_CAPSULE | Freq: Two times a day (BID) | ORAL | Status: AC

## 2015-02-05 MED ORDER — OXYCODONE-ACETAMINOPHEN 5-325 MG PO TABS: 1.0000 | ORAL_TABLET | ORAL | Status: DC | PRN

## 2015-02-05 NOTE — Progress Notes (Signed)

## 2015-02-05 NOTE — Discharge Instructions (Signed)
Postpartum Care After Cesarean Delivery After you deliver your newborn (postpartum period), the usual stay in the hospital is 24-72 hours. If there were problems with your labor or delivery, or if you have other medical problems, you might be in the hospital longer.  While you are in the hospital, you will receive help and instructions on how to care for yourself and your newborn during the postpartum period.  While you are in the hospital:  It is normal for you to have pain or discomfort from the incision in your abdomen. Be sure to tell your nurses when you are having pain, where the pain is located, and what makes the pain worse.  If you are breastfeeding, you may feel uncomfortable contractions of your uterus for a couple of weeks. This is normal. The contractions help your uterus get back to normal size.  It is normal to have some bleeding after delivery.  For the first 1-3 days after delivery, the flow is red and the amount may be similar to a period.  It is common for the flow to start and stop.  In the first few days, you may pass some small clots. Let your nurses know if you begin to pass large clots or your flow increases.  Do not  flush blood clots down the toilet before having the nurse look at them.  During the next 3-10 days after delivery, your flow should become more watery and pink or brown-tinged in color.  Ten to fourteen days after delivery, your flow should be a small amount of yellowish-white discharge.  The amount of your flow will decrease over the first few weeks after delivery. Your flow may stop in 6-8 weeks. Most women have had their flow stop by 12 weeks after delivery.  You should change your sanitary pads frequently.  Wash your hands thoroughly with soap and water for at least 20 seconds after changing pads, using the toilet, or before holding or feeding your newborn.  Your intravenous (IV) tubing will be removed when you are drinking enough fluids.  The  urine drainage tube (urinary catheter) that was inserted before delivery may be removed within 6-8 hours after delivery or when feeling returns to your legs. You should feel like you need to empty your bladder within the first 6-8 hours after the catheter has been removed.  In case you become weak, lightheaded, or faint, call your nurse before you get out of bed for the first time and before you take a shower for the first time.  Within the first few days after delivery, your breasts may begin to feel tender and full. This is called engorgement. Breast tenderness usually goes away within 48-72 hours after engorgement occurs. You may also notice milk leaking from your breasts. If you are not breastfeeding, do not stimulate your breasts. Breast stimulation can make your breasts produce more milk.  Spending as much time as possible with your newborn is very important. During this time, you and your newborn can feel close and get to know each other. Having your newborn stay in your room (rooming in) will help to strengthen the bond with your newborn. It will give you time to get to know your newborn and become comfortable caring for your newborn.  Your hormones change after delivery. Sometimes the hormone changes can temporarily cause you to feel sad or tearful. These feelings should not last more than a few days. If these feelings last longer than that, you should talk to your  caregiver.  If desired, talk to your caregiver about methods of family planning or contraception.  Talk to your caregiver about immunizations. Your caregiver may want you to have the following immunizations before leaving the hospital:  Tetanus, diphtheria, and pertussis (Tdap) or tetanus and diphtheria (Td) immunization. It is very important that you and your family (including grandparents) or others caring for your newborn are up-to-date with the Tdap or Td immunizations. The Tdap or Td immunization can help protect your newborn  from getting ill.  Rubella immunization.  Varicella (chickenpox) immunization.  Influenza immunization. You should receive this annual immunization if you did not receive the immunization during your pregnancy. Document Released: 04/29/2012 Document Reviewed: 04/29/2012 [facility] Patient Information 2015 [place], [place]. This information is not intended to replace advice given to you by your health care provider. Make sure you discuss any questions you have with your health care provider.  Urinary Tract Infection Urinary tract infections (UTIs) can develop anywhere along your urinary tract. Your urinary tract is your body's drainage system for removing wastes and extra water. Your urinary tract includes two kidneys, two ureters, a bladder, and a urethra. Your kidneys are a pair of bean-shaped organs. Each kidney is about the size of your fist. They are located below your ribs, one on each side of your spine. CAUSES Infections are caused by microbes, which are microscopic organisms, including fungi, viruses, and bacteria. These organisms are so small that they can only be seen through a microscope. Bacteria are the microbes that most commonly cause UTIs. SYMPTOMS  Symptoms of UTIs may vary by age and gender of the patient and by the location of the infection. Symptoms in young women typically include a frequent and intense urge to urinate and a painful, burning feeling in the bladder or urethra during urination. Older women and men are more likely to be tired, shaky, and weak and have muscle aches and abdominal pain. A fever may mean the infection is in your kidneys. Other symptoms of a kidney infection include pain in your back or sides below the ribs, nausea, and vomiting. DIAGNOSIS To diagnose a UTI, your caregiver will ask you about your symptoms. Your caregiver also will ask to provide a urine sample. The urine sample will be tested for bacteria and white blood cells. White blood cells are made  by your body to help fight infection. TREATMENT  Typically, UTIs can be treated with medication. Because most UTIs are caused by a bacterial infection, they usually can be treated with the use of antibiotics. The choice of antibiotic and length of treatment depend on your symptoms and the type of bacteria causing your infection. HOME CARE INSTRUCTIONS  If you were prescribed antibiotics, take them exactly as your caregiver instructs you. Finish the medication even if you feel better after you have only taken some of the medication.  Drink enough water and fluids to keep your urine clear or pale yellow.  Avoid caffeine, tea, and carbonated beverages. They tend to irritate your bladder.  Empty your bladder often. Avoid holding urine for long periods of time.  Empty your bladder before and after sexual intercourse.  After a bowel movement, women should cleanse from front to back. Use each tissue only once. SEEK MEDICAL CARE IF:   You have back pain.  You develop a fever.  Your symptoms do not begin to resolve within 3 days. SEEK IMMEDIATE MEDICAL CARE IF:   You have severe back pain or lower abdominal pain.  You develop  chills.  You have nausea or vomiting.  You have continued burning or discomfort with urination. MAKE SURE YOU:   Understand these instructions.  Will watch your condition.  Will get help right away if you are not doing well or get worse. Document Released: 05/15/2005 Document Revised: 02/04/2012 Document Reviewed: 09/13/2011 [facility] Patient Information 2015 [place], [place]. This information is not intended to replace advice given to you by your health care provider. Make sure you discuss any questions you have with your health care provider.

## 2015-02-05 NOTE — Discharge Summary (Signed)
  Cesarean Section Delivery Discharge Summary  Eera Butter  DOB:    06/28/1981 MRN:    [id number] CSN:    [id number]  Date of admission:                  01/26/15  Date of discharge:                   02/05/15  Procedures this admission:  Primary LTCS for PPROM, PTL, malpresentation, and NRFHR  Date of Delivery: 02/02/15  Newborn Data:  Live born female  Birth Weight: 2 lb 13.9 oz (1300 g) APGAR: 3, 8  Baby remains in NICU at the time of patient's d/c Name: Autumn Mclaughlin   History of Present Illness:  Ms. Autumn Mclaughlin is a 34 y.o. female, [phone], who presents at [redacted]w[redacted]d weeks gestation. The patient has been followed at [facility]. Her pregnancy has been complicated by:  Patient Active Problem List   Diagnosis Date Noted  . Status post primary low transverse cesarean section 02/05/2015  . Preterm premature rupture of membranes (PPROM) with unknown onset of labor 01/26/2015    Hospital Course:  Admitting Dx:   IUP at 29 weeks, PPROM at 28 weeks, hypothyroidism Received ATB for latency and betamethasone.  Had been on 17P due to hx of previous PTD at 32 weeks.  Fetus was normally grown.  MFM and Neo consults were obtained.  On 6/16, patient began laboring, with transverse presentation noted and cervix 2 cm dilated, with variable decels noted.  She was consented by Dr. Charlotta Newton for C/S.  LTCS incision was performed.  Baby was taken to NICU for further care.  By patient's d/c date, baby was off supplemental O2 and was beginning to be fed orally.  Patient was noted to have + Ecoli on urine culture, resistant to ampicillin.  Rx'd with Keflex, with BID x 7 day course begun on 02/04/15, and continued after d/c.  Rationale for C/S: PTL, malpresentation, NRFHR Anesthesia:  Spinal Surgeon:  Dr. Charlotta Newton Complications: None  Intrapartum Procedures: cesarean: low cervical, transverse Postpartum Procedures: none Complications-Operative and Postpartum: none  Discharge Diagnoses: PPROM x 1  week, PTL, non-reassuring FHR, malpresentation, bacteriuria  Feeding:  breast  Contraception:  Will discuss with Dr. Charlotta Newton  Hemoglobin Results:  CBC CBC Latest Ref Rng 02/03/2015 01/26/2015 08/11/2008  WBC 4.0 - 10.5 K/uL 17.4(H) 10.0 -  Hemoglobin 12.0 - 15.0 g/dL 10.1(L) 11.1(L) 17.3(H)  Hematocrit 36.0 - 46.0 % 29.6(L) 31.9(L) 51.0(H)  Platelets 150 - 400 K/uL 140(L) 170 -      Discharge Physical Exam:   General: alert Lochia: appropriate Uterine Fundus: firm Abdomen:  + bowel sounds, + BM today Incision: Honeycomb dressing CDI--dsg changed last evening. DVT Evaluation: No evidence of DVT seen on physical exam.  Discharge Information:  Activity:           pelvic rest Diet:                routine Medications: Ibuprofen, Percocet and Keflex, and to continue Levothyroxine Condition:      stable Instructions:  Discharge to: home  Follow-up Information    Follow up with Myna Hidalgo, M, DO In 2 weeks.   Specialty:  Obstetrics and Gynecology   Why:  Call for any questions or concerns.   Contact information:   [address] [place] [address] [phone]        Nigel Bridgeman [facility] 02/05/2015 9:47 AM

## 2015-02-05 NOTE — Telephone Encounter (Signed)
TC from patient--d/c'd today from hospital, [facility] to [facility]. [facility] will fill Ibuprophen and Keflex rxs.  Patient will pick up Percocet at own pharmacy tomorrow.  TC to patient to inform.  Nigel Bridgeman, CNM 02/05/15 6:25p

## 2015-02-12 ENCOUNTER — Ambulatory Visit: Payer: Self-pay

## 2015-02-12 NOTE — Lactation Note (Signed)
This note was copied from the chart of Autumn Mclaughlin. Lactation Consultation Note  Patient Name: Autumn Mclaughlin [id number] Date: 02/12/2015 Reason for consult: Follow-up assessment Called by NICU RN to speak to Autumn Mclaughlin regarding engorgement. Went to see Autumn Mclaughlin in NICU. Her right breast is firm, red in the outer quadrants and across top of breast. Clear blisters on both nipples, left breast soft. Mom has been pumping every 2 hours today to relieve engorgement and has used ice packs 2-3 times. LC assisted Mom with pumping at this visit, massaging breast. Nodules in the outer quadrant and across right breast softened but did not resolve. Plan discussed with Mom is to apply warm compress for 20 minutes prior to pumping, pump with massage for 20-30 minutes, then apply ice packs after pumping. Use size 30 flanges with pumping. If breasts are not emptying with warm compress then switch to cold compress for 20 minutes prior to pumping, then pump with massage for 20-30 minutes, then apply ice again after pumping. Keep this routine every 2 hours. Take Ibuprofen as prescribed. If fever, body aches, chills develop with increase redness and warmth at breast call OB. See LC in the am when Mom comes to see baby. Have RN call for [facility] to evaluate. Comfort gels given for nipple tenderness with blisters.   Maternal Data    Feeding Feeding Type: Breast Milk  LATCH Score/Interventions          Comfort (Breast/Nipple): Engorged, cracked, bleeding, large blisters, severe discomfort Problem noted: Engorgment;Cracked, bleeding, blisters, bruises (small blisters end of nipple bilateral)           Lactation Tools Discussed/Used Tools: Comfort gels   Consult Status Consult Status: Follow-up Date: 02/13/15 Follow-up type: In-patient    Alfred Levins 02/12/2015, 9:43 PM

## 2015-02-13 ENCOUNTER — Ambulatory Visit: Payer: Self-pay

## 2015-02-13 NOTE — Lactation Note (Addendum)
This note was copied from the chart of Autumn Mclaughlin. Lactation Consultation Note  Patient Name: Autumn Mclaughlin [id number]'s Date: 02/13/2015 Reason for consult: Follow-up assessment;NICU baby  NICU baby 4711 days old, 3166w4d CGA. Mom's right breast is still firm, but more softened than last night. Assisted mom to hand express after pumping and mom reported increased comfort. Enc mom to convert sports bra for "hands-free" pumping so that she can use her hands to massage breasts while pumping with DEBP. Enc mom to use ice prior to pumping, then massage breasts with her hands. Enc mom to hand express prior to pumping in order to get milk flowing. Then enc mom to use DEBP ever 2-3 hours until breasts staying soft, followed by hand expression. Discussed with mom that it might take another day or 2 before breast swelling goes down. Enc mom to start sleeping longer at night after this episode of engorgement passes. Enc mom at that time to sleep 5-6 hours at night, then pump more often in the morning, every 2 hours, followed by every 2-3 during the day. Discussed with mom that she will produce more milk if she can get more rest. Enc mom to pump if fullness of breasts wake her up at night. Mom about to hold baby STS, enc mom to follow with pumping plan afterwards. Also enc mom to reduce pressure level of pump if she is still seeing "blister" areas on nipple. Enc mom to use thin layer of coconut oil on flanges or nipples to reduce friction while pumping.  Maternal Data    Feeding Feeding Type: Breast Milk  LATCH Score/Interventions                      Lactation Tools Discussed/Used     Consult Status Consult Status: PRN    Autumn Mclaughlin, Autumn Mclaughlin 02/13/2015, 10:28 AM

## 2015-02-17 ENCOUNTER — Ambulatory Visit: Payer: Self-pay

## 2015-02-17 NOTE — Lactation Note (Signed)
This note was copied from the chart of Girl Mishika Flippen. Lactation Consultation Note  I met with mom in the NICU.  Mom concerned she may be still engorged because she feels some firm areas.  Mom is pumping every 2-3 hours during the day and 5-6 hours at night.  She obtains 30-45 mls.  I examined her breasts.  Most of the breasts are soft with 2 small firm areas on outer area of left breast and one small area on inner part of right breast.  Minimal tenderness and no redness.  Bra noted to be tight. Nipples are still scabbed but healing.  Explained to mom that at 2 weeks post delivery her breasts are no longer engorged.  Discussed probable plugged ducts.  Instructed on heat, massage and frequent pumping using hands on pumping.  Also suggested a bra that fits bette.  Recommended she try to go down to the 27 mm flange and use coconut oil on the flanges.  If here flange size is too big she may be not be healing or emptying well.  Encouraged to let us know if not improvement after a few days.  Patient Name: Girl Sincerity Cedar [id number] Date: 02/17/2015 Reason for consult: Follow-up assessment;NICU baby   Maternal Data    Feeding    LATCH Score/Interventions                      Lactation Tools Discussed/Used     Consult Status      Ave Filter 02/17/2015, 2:26 PM

## 2015-03-06 ENCOUNTER — Ambulatory Visit: Payer: Self-pay

## 2015-03-06 NOTE — Lactation Note (Signed)
This note was copied from the chart of Autumn Mclaughlin. Lactation Consultation Note  Patient Name: Autumn Faythe CasaMerissa Ricketson RUEAV'WToday's Date: 03/06/2015 Reason for consult: Follow-up assessment    with this mom of a NICU baby, now 594 weeks old and 33 4/7 weeks CGA. Mom noticed a severe decrease in her milk supply a few days ago. In talking to mom, this happened when her mom [facility]([name]) and mom's 32 year old son came [place] from [place]. [name] has had physical custody of her son since he was 32 years old. Mom became tearful talking about her son, saying she wants her son back, but would have to fight her mother, and feels her mom does not respect (my words, not her;s) her as a mom. I told mom she may have seen a decrease due to stress, and also mom admitts to not staying hydrated enough. I assisted her with pumping, and showed mom how to massage her breasts while pumping. It looked like she was going to be able to express about 60 ml's this pumping, which is more than she expressed last time (45 mls). Mom had been expressing at least 90 mls every 3 hours. She is pumping every 2 hours now, and every 4 hours twice at night, and this seems to be helping. She is going to try some galactagouges  - Moringa, fenugreek and Mother's love was suggested.   I also advised mom to speak to NICU social worker, Lulu RidingColleen Shaw, and see if she can lead her to a Medicaid therapist, which I think may be helpful for mom at this time. I told mom that having a new baby is probably bring back all the sad feelings about her son, from years ago.  Mom's nurse, Aly, was going to call Verda CuminsCollen and ask that she speak to mom.    Maternal Data    Feeding Feeding Type: Breast Milk Length of feed: 45 min  LATCH Score/Interventions                      Lactation Tools Discussed/Used     Consult Status Consult Status: PRN Follow-up type: In-patient (NICU)    Alfred LevinsLee, Gina Leblond Anne 03/06/2015, 10:58 AM

## 2015-03-07 ENCOUNTER — Ambulatory Visit: Payer: Self-pay

## 2015-03-07 NOTE — Lactation Note (Signed)
This note was copied from the chart of Autumn Faythe CasaMerissa Faulkenberry. Lactation Consultation Note  NICU RN called to come speak to mom about low milk supply,  When I arrived mom was holding baby skin to skin.  We discussed the importance of skin to skin on a regular basis and pumping at bedside.  I told mom if baby shows rooting behavior while on chest to allow her to lick or nuzzle breast.  Mom verbalizing her stressors with her mom and I listened and offered support.  Reinforced LC's recommendations from yesterday's conversation.  Will continue to follow and support.  Patient Name: Autumn Mclaughlin [id number]'UToday's Date: 03/07/2015     Maternal Data    Feeding Feeding Type: Breast Milk Length of feed: 30 min  LATCH Score/Interventions                      Lactation Tools Discussed/Used     Consult Status      [name], [name] 03/07/2015, 3:32 PM

## 2015-03-08 ENCOUNTER — Ambulatory Visit: Payer: Self-pay

## 2015-03-08 NOTE — Lactation Note (Signed)
This note was copied from the chart of Autumn Faythe CasaMerissa Kohrs. Lactation Consultation Note  Patient Name: Autumn Mclaughlin [id number]'VToday's Date: 03/08/2015 Reason for consult: Follow-up assessment;NICU baby NICU baby 994 weeks old, 171w6d CGA. Called to assist with latching baby. Assisted mom to latch baby to left breast in football position. Mom has large nipples and baby attempted to suckle several times, but could not maintain a latch. Mom able to hand express colostrum easily. Fitted mom with a #24 NS and baby able to maintain a latch, and suckled off-and-on for 15 minutes. No swallows were noted, but baby alert with eyes wide open at breast for the full 15 minutes. Discussed with mom that this was a very good first attempt. Also discussed with mom that the NS is just a temporary tool to assist the baby at the breast.    Mom had pumped at the bedside and states that she feels her supply may have decreased. Mom reports that she has not been able to pump regularly, 8 times a day, but has been drinking Fenugreek tea and wants to know what else she can do to increase her milk supply. Discussed with mom that having the baby at breast is good for her supply, but there is no substitute for having baby at breast and pumping. Enc mom to pump more often in the morning when she first gets up, and then pump soon rather than later. Enc her to pump make up for any missed pumps, and discussed power-pumping as well. Enc mom to hand express before and after pumping as well.    Maternal Data    Feeding Feeding Type: Breast Fed Length of feed: 15 min  LATCH Score/Interventions Latch: Repeated attempts needed to sustain latch, nipple held in mouth throughout feeding, stimulation needed to elicit sucking reflex.  Audible Swallowing: None Intervention(s): Skin to skin;Hand expression  Type of Nipple: Everted at rest and after stimulation  Comfort (Breast/Nipple): Soft / non-tender     Hold (Positioning):  Assistance needed to correctly position infant at breast and maintain latch.  LATCH Score: 6  Lactation Tools Discussed/Used Tools: Nipple Shields Nipple shield size: 24   Consult Status Consult Status: PRN    [name], Johnwesley Lederman 03/08/2015, 3:21 PM

## 2015-03-13 ENCOUNTER — Ambulatory Visit: Payer: Self-pay

## 2015-03-13 NOTE — Lactation Note (Signed)
This note was copied from the chart of Autumn Mclaughlin. Lactation Consultation Note  Patient Name: Autumn Mclaughlin [id number] Date: 03/13/2015 Reason for consult: Follow-up assessment;NICU baby  NICU baby 60 weeks old, [redacted]w[redacted]d CGA. Mom states that her supply is up and she is pumping regularly now. Discussed how to nurse and pump and return to work. 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 Discussed talking to employer about a place and way of pumping before she returns to work. Enc mom to call for assistance as needed.  Maternal Data    Feeding Feeding Type: Breast Milk Length of feed: 30 min  LATCH Score/Interventions                      Lactation Tools Discussed/Used     Consult Status Consult Status: PRN    Geralynn Ochs 03/13/2015, 2:58 PM

## 2015-03-15 ENCOUNTER — Ambulatory Visit: Payer: Self-pay

## 2015-03-15 NOTE — Lactation Note (Signed)
This note was copied from the chart of Autumn Mclaughlin. Lactation Consultation Note  Patient Name: Autumn Mclaughlin [id number] Date: 03/15/2015  NICU baby 28 weeks old. Mom requested another #24 NS--someone threw out the shield that she had in the baby's crib. Mom states that baby nurses with shield well for 5-10 minutes, then falls asleep. Demonstrated how to stimulate the baby to continue suckling by stroking the baby's arm or rubbing the baby's hand. Mom states that her supply is much better now and she is continuing to pump on a regular schedule.   Maternal Data    Feeding Feeding Type: Breast Milk Length of feed: 30 min  LATCH Score/Interventions                      Lactation Tools Discussed/Used     Consult Status      Autumn Mclaughlin, Autumn Mclaughlin 03/15/2015, 2:22 PM

## 2015-04-03 ENCOUNTER — Ambulatory Visit: Payer: Self-pay

## 2015-04-03 NOTE — Lactation Note (Signed)
This note was copied from the chart of Autumn Kaye Luoma. Lactation Consultation Note  Patient Name: Autumn Mclaughlin [id number] Date: 04/03/2015 Reason for consult: Follow-up assessment   With this mom of an 25 week old NICU baby, now 33 4/7 weeks CGA. Mom roomed in with the baby last night, and will be taking her baby home today. Mom would like to transition the baby to breastfeeding. She has a nipple shield, and will practice when home. Mom was given information on how to make an o/p lactation consult, once she gets the baby settled at home.    Maternal Data    Feeding    LATCH Score/Interventions                      Lactation Tools Discussed/Used     Consult Status Consult Status: Follow-up Follow-up type: Call as needed    Autumn Mclaughlin 04/03/2015, 12:00 PM

## 2016-04-29 ENCOUNTER — Other Ambulatory Visit: Payer: Self-pay | Admitting: Internal Medicine

## 2016-04-29 DIAGNOSIS — R945 Abnormal results of liver function studies: Secondary | ICD-10-CM

## 2016-05-08 ENCOUNTER — Other Ambulatory Visit: Payer: Medicaid Other

## 2016-05-09 ENCOUNTER — Other Ambulatory Visit: Payer: Medicaid Other

## 2016-06-20 IMAGING — US US OB COMP +14 WK
1 series · 12 of 28 positions shown · non-contrast
Comparison: none

[Series 1: us ob follow up · 12 of 60 slices shown]
[im 3/60]
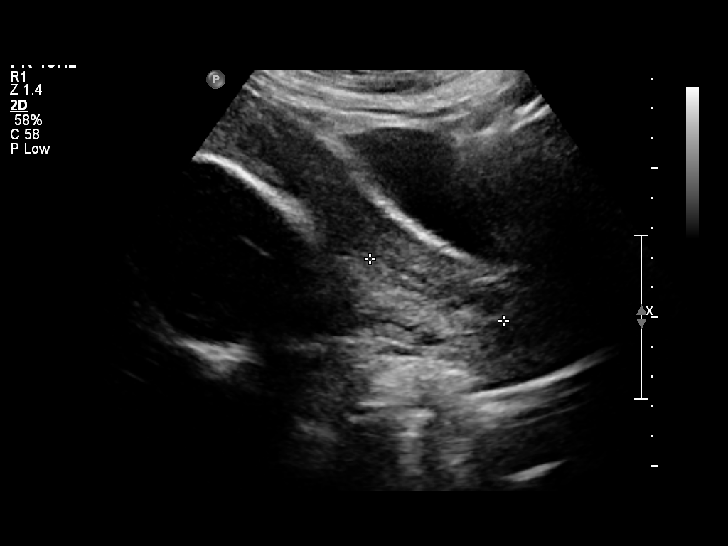
[im 7/60]
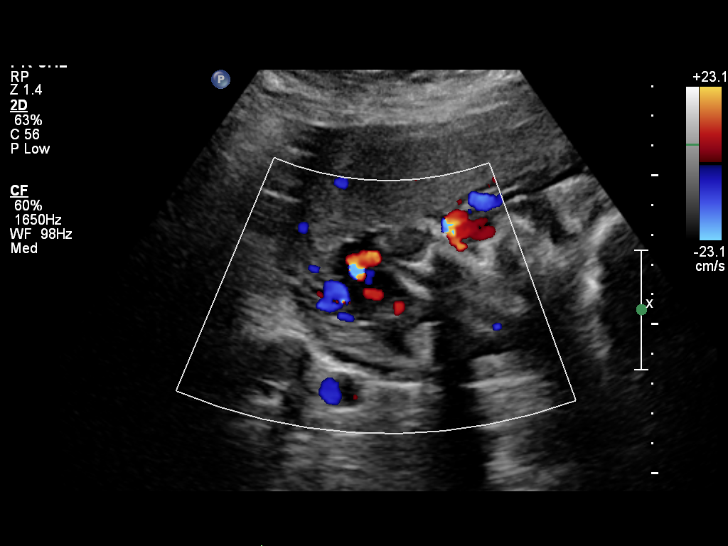
[im 11/60]
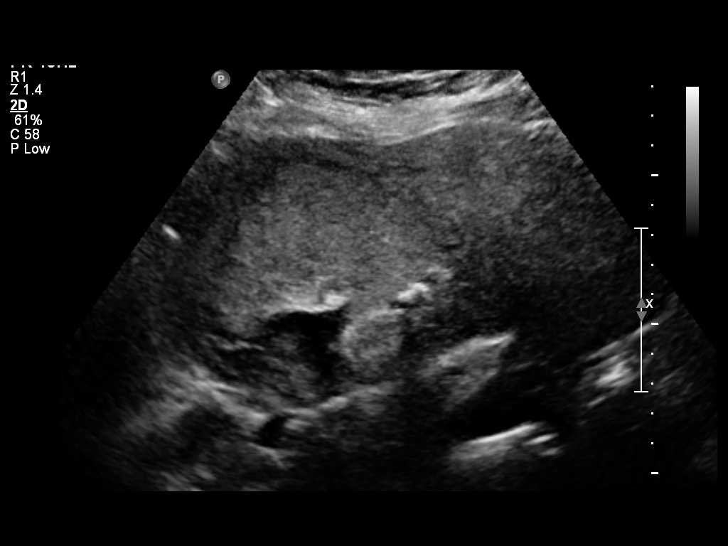
[im 18/60]
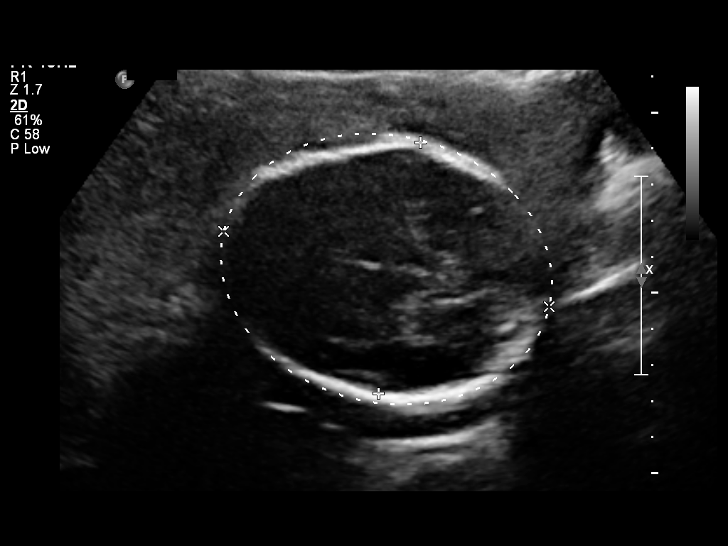
[im 22/60]
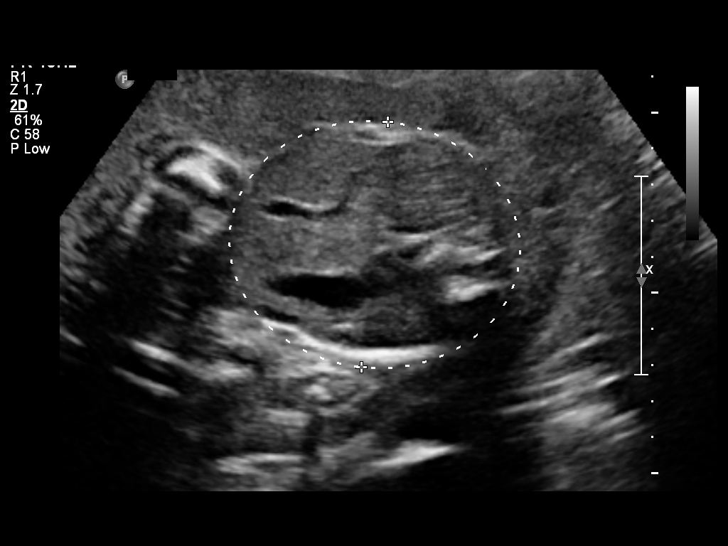
[im 27/60]
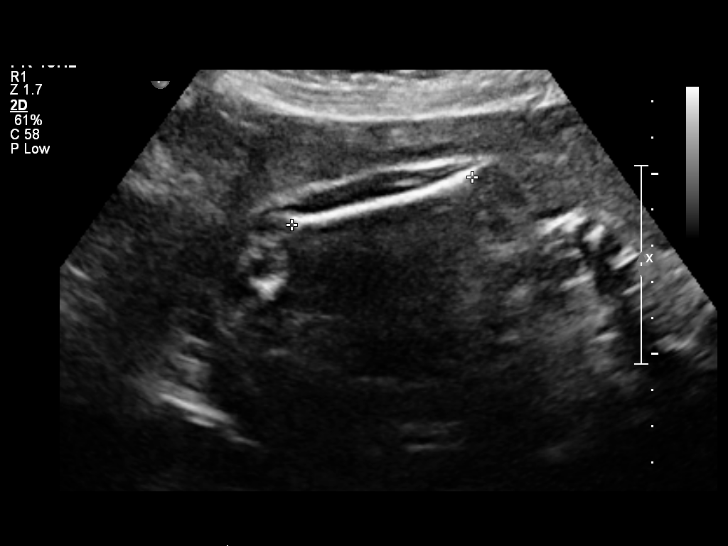
[im 33/60]
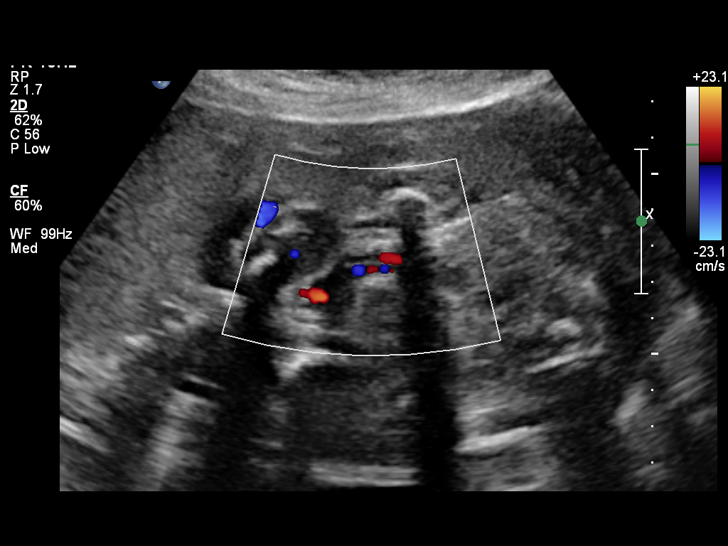
[im 38/60]
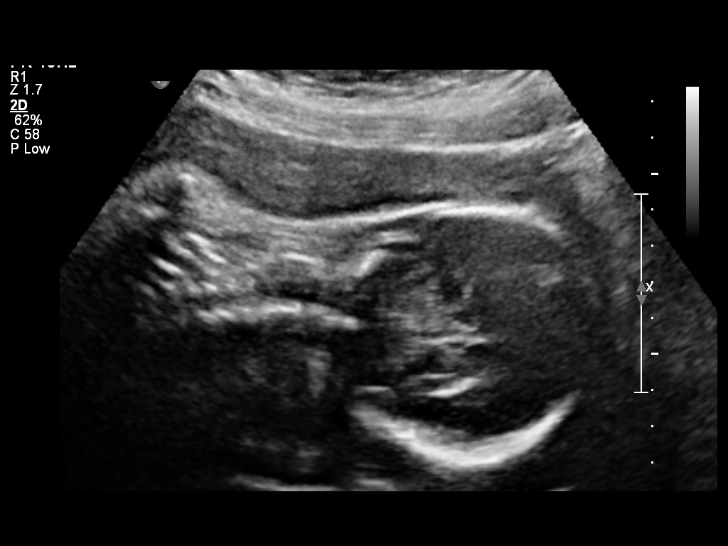
[im 42/60]
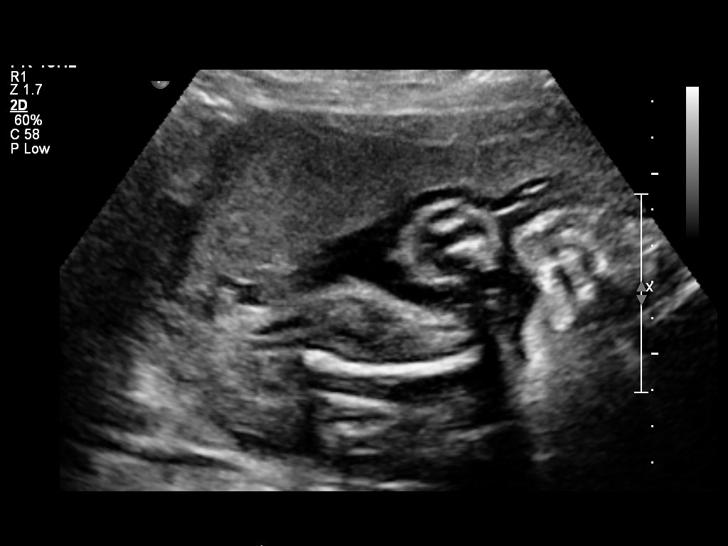
[im 49/60]
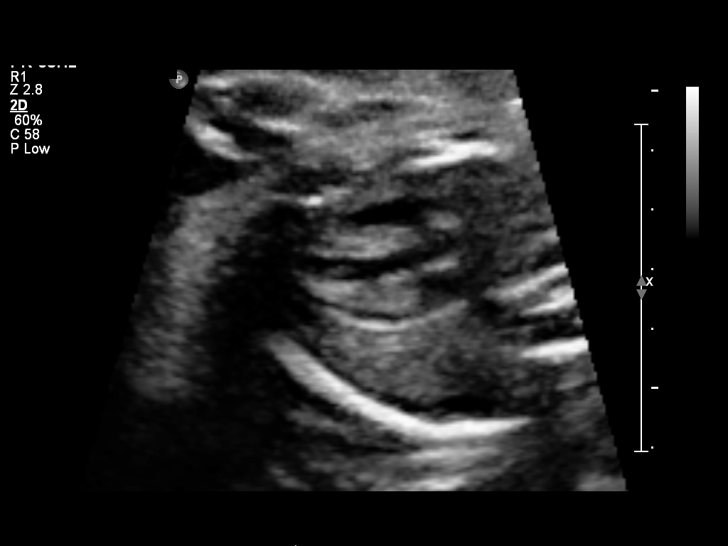
[im 53/60]
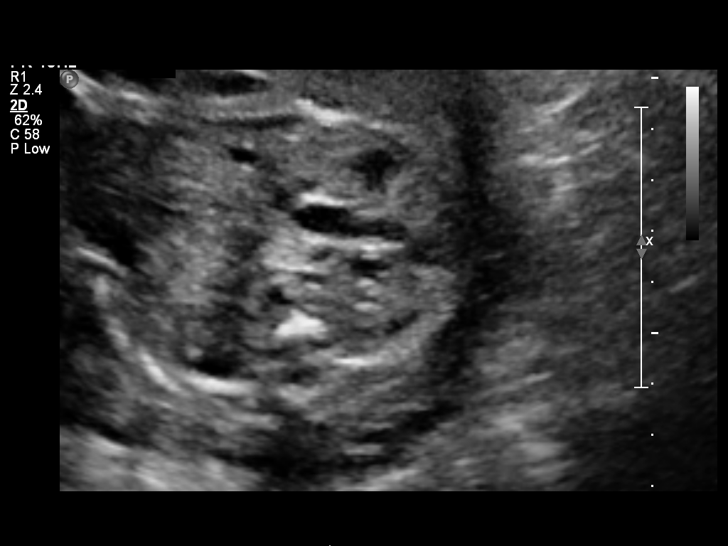
[im 57/60]
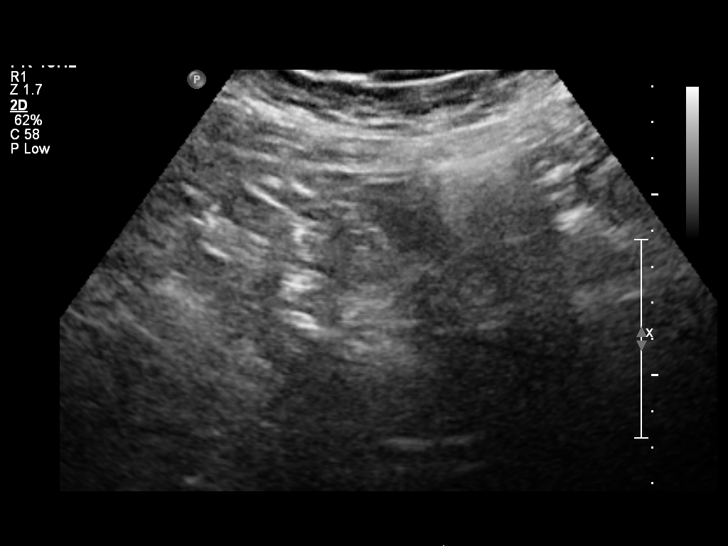

[12 of 28 positions shown; findings below may reference images not displayed]

OBSTETRICS REPORT
(Signed Final 01/26/2015 [DATE])

Service(s) Provided

US OB COMP + 14 WK                                    76805.1
Indications

Premature rupture of membranes - leaking fluid -
+FERN
Poor obstetric history: Previous preterm delivery -
32 wks PPROM
Obesity complicating pregnancy, second trimester
17-P
27 weeks gestation of pregnancy
Fetal Evaluation

Num Of Fetuses:    1
Fetal Heart Rate:  156                          bpm
Cardiac Activity:  Observed
Presentation:      Cephalic
Placenta:          Anterior, above cervical os
P. Cord            Not well visualized
Insertion:

Amniotic Fluid
AFI FV:      Anhydramnios
AFI Sum:     0       cm      < 3  %Tile
Biometry

BPD:     70.2  mm     G. Age:  28w 1d                CI:        73.48   70 - 86
FL/HC:      19.8   18.8 -
20.6
HC:     260.2  mm     G. Age:  28w 2d       28  %    HC/AC:      1.12   1.05 -
1.21
AC:       232  mm     G. Age:  27w 4d       29  %    FL/BPD:     73.2   71 - 87
FL:      51.4  mm     G. Age:  27w 3d       22  %    FL/AC:      22.2   20 - 24
HUM:     48.2  mm     G. Age:  28w 2d       52  %
CER:     31.6  mm     G. Age:  27w 4d       44  %

Est. FW:    0073  gm      2 lb 7 oz     43  %
Gestational Age

Clinical EDD:  28w 0d                                        EDD:   04/20/15
U/S Today:     27w 6d                                        EDD:   04/21/15
Best:          28w 0d     Det. By:  Clinical EDD             EDD:   04/20/15
Anatomy

Cranium:          Appears normal         Aortic Arch:      Not well visualized
Fetal Cavum:      Not well visualized    Ductal Arch:      Not well visualized
Ventricles:       Appears normal         Diaphragm:        Appears normal
Choroid Plexus:   Appears normal         Stomach:          Appears normal, left
sided
Cerebellum:       Appears normal         Abdomen:          Appears normal
Posterior Fossa:  Appears normal         Abdominal Wall:   Appears nml (cord
insert, abd wall)
Nuchal Fold:      Not applicable (>20    Cord Vessels:     Appears normal (3
wks GA)                                  vessel cord)
Face:             Orbits appear          Kidneys:          Appear normal
normal
Lips:             Not well visualized    Bladder:          Appears normal
Heart:            Appears normal         Spine:            Not well visualized
(4CH, axis, and
situs)
RVOT:             Appears normal         Lower             Appears normal
Extremities:
LVOT:             Appears normal         Upper             LUE not wwell seen
Extremities:

Other:  Technically difficult due to  maternal habitus. Technically difficult due
to NO amniotic fluid. Hands/feet not well seen.
Targeted Anatomy

Fetal Central Nervous System
Lat. Ventricles:
Cervix Uterus Adnexa

Cervical Length:    4        cm

Cervix:       Normal appearance by transabdominal scan.

Left Ovary:    Not visualized.
Right Ovary:   Not visualized.
Impression

Singleton intrauterine pregnancy at 28 weeks 0 days
gestation with fetal cardiac activity
Cephalic presentation
Limited fetal anatomy noted secondary to maternal habitus
and anhydramnios
Normal appearing fetal growth
Anterior placenta without evidence of previa
Recommendations

Follow-up ultrasounds as clinically indicated.

## 2017-06-03 ENCOUNTER — Emergency Department (HOSPITAL_COMMUNITY)
Admission: EM | Admit: 2017-06-03 | Discharge: 2017-06-03 | Disposition: A | Discharge status: Home / Self Care | Payer: Medicaid Other | Attending: Emergency Medicine | Admitting: Emergency Medicine

## 2017-06-03 ENCOUNTER — Encounter (HOSPITAL_COMMUNITY): Payer: Self-pay | Admitting: Emergency Medicine

## 2017-06-03 DIAGNOSIS — J029 Acute pharyngitis, unspecified: Secondary | ICD-10-CM

## 2017-06-03 DIAGNOSIS — R070 Pain in throat: Secondary | ICD-10-CM | POA: Diagnosis present

## 2017-06-03 DIAGNOSIS — E039 Hypothyroidism, unspecified: Secondary | ICD-10-CM | POA: Insufficient documentation

## 2017-06-03 DIAGNOSIS — Z87891 Personal history of nicotine dependence: Secondary | ICD-10-CM | POA: Insufficient documentation

## 2017-06-03 LAB — RAPID STREP SCREEN (MED CTR MEBANE ONLY): Streptococcus, Group A Screen (Direct): NEGATIVE

## 2017-06-03 MED ORDER — CHLORHEXIDINE GLUCONATE 0.12 % MT SOLN: 15.0000 mL | Freq: Two times a day (BID) | OROMUCOSAL | 0 refills | Status: DC

## 2017-06-03 MED ORDER — IBUPROFEN 600 MG PO TABS: 600.0000 mg | ORAL_TABLET | Freq: Four times a day (QID) | ORAL | 0 refills | Status: DC | PRN

## 2017-06-03 NOTE — ED Triage Notes (Signed)
Patient reports sore throat with swelling and occasional dysphagia with  dry cough onset 2 days ago . Denies fever or chills .

## 2017-06-03 NOTE — ED Provider Notes (Signed)
[facility] EMERGENCY DEPARTMENT Provider Note   CSN: [id number] Arrival date & time: 06/03/17  0536     History   Chief Complaint Chief Complaint  Patient presents with  . Sore Throat    HPI Autumn Mclaughlin is a 36 y.o. female.  HPI   36 year old female presenting complaining of sore throat.patient states for the past week she has cold symptoms including congestion, postnasal drainage, generalized weakness, occasional vomiting, and having sore throat. Most of the symptom has resolved except for sore throat. For the past 2 days she is having trouble swallowing, and Mucinex hasn't provided significant relief. She is a smoker. Last menstrual period was yesterday. She denies any recent sick contact. She does have a 42-year-old daughter which she is concerned about her sickness.she denies any severe headache, shortness of breath, productive cough, abdominal pain, or rash.    Past Medical History:  Diagnosis Date  . H/O cone biopsy of cervix   . Hypothyroidism   . Preterm labor     Patient Active Problem List   Diagnosis Date Noted  . Status post primary low transverse cesarean section 02/05/2015  . Preterm premature rupture of membranes (PPROM) with unknown onset of labor 01/26/2015    Past Surgical History:  Procedure Laterality Date  . CESAREAN SECTION N/A 02/02/2015   Procedure: CESAREAN SECTION;  Surgeon: Myna Hidalgo, DO;  Location: [facility];  Service: Obstetrics;  Laterality: N/A;  . EYE SURGERY      OB History    Gravida Para Term Preterm AB Living   SAB TAB Ectopic Multiple Live Births   2     0 2       Home Medications    Prior to Admission medications   Medication Sig Start Date End Date Taking? Authorizing Provider  ibuprofen (ADVIL,MOTRIN) 600 MG tablet Take 1 tablet (600 mg total) by mouth every 6 (six) hours as needed. 02/05/15   Nigel Bridgeman, CNM  levothyroxine (SYNTHROID, LEVOTHROID) 100 MCG tablet Take 100 mcg by  mouth daily before breakfast.    [provider]  oxyCODONE-acetaminophen (PERCOCET/ROXICET) 5-325 MG per tablet Take 1 tablet by mouth every 4 (four) hours as needed (for pain scale 4-7). 02/05/15   Nigel Bridgeman, CNM  Prenatal Vit-Fe Fumarate-FA (PRENATAL MULTIVITAMIN) TABS tablet Take 1 tablet by mouth daily at 12 noon.    [provider]    Family History Family History  Problem Relation Age of Onset  . Diabetes Maternal Grandfather   . Asthma Sister   . Cancer Sister   . Cancer Maternal Grandmother   . Cancer Paternal Grandmother     Social History Social History  Substance Use Topics  . Smoking status: Former Smoker    Packs/day: 1.00    Types: Cigarettes    Quit date: 08/19/2014  . Smokeless tobacco: Never Used  . Alcohol use No     Allergies   Sulfa antibiotics   Review of Systems Review of Systems  Constitutional: Negative for fever.  HENT: Positive for sore throat.   All other systems reviewed and are negative.    Physical Exam Updated Vital Signs BP 126/82 (BP Location: Left Arm)   Pulse 95   Temp 98.3 F (36.8 C) (Oral)   Resp 18   Ht  (1.651 m)   Wt 117.9 kg (260 lb)   LMP 06/02/2017   SpO2 96%   BMI 43.27 kg/m   Physical Exam  Constitutional: She appears well-developed and well-nourished. No distress.  Obese female resting in the chair in no acute discomfort. Normal phonation  HENT:  Head: Atraumatic.  Right Ear: External ear normal.  Left Ear: External ear normal.  Mouth/Throat: Oropharynx is clear and moist.  Throat: Uvula is midline no tonsillar enlargement or exudates. Mild posterior oropharyngeal erythema, no trismus  Eyes: Conjunctivae are normal.  Neck: Neck supple.  No nuchal rigidity  Cardiovascular: Normal rate and regular rhythm.   Pulmonary/Chest: Effort normal and breath sounds normal.  Abdominal: Soft. Bowel sounds are normal. She exhibits no distension. There is no tenderness.  Lymphadenopathy:     She has no cervical adenopathy.  Neurological: She is alert.  Skin: No rash noted.  Psychiatric: She has a normal mood and affect.  Nursing note and vitals reviewed.    ED Treatments / Results  Labs (all labs ordered are listed, but only abnormal results are displayed) Labs Reviewed  RAPID STREP SCREEN (NOT AT [facility])  CULTURE, GROUP A STREP [facility])    EKG  EKG Interpretation None       Radiology No results found.  Procedures Procedures (including critical care time)  Medications Ordered in ED Medications - No data to display   Initial Impression / Assessment and Plan / ED Course  I have reviewed the triage vital signs and the nursing notes.  Pertinent labs & imaging results that were available during my care of the patient were reviewed by me and considered in my medical decision making (see chart for details).     BP 126/82 (BP Location: Left Arm)   Pulse 95   Temp 98.3 F (36.8 C) (Oral)   Resp 18   Ht  (1.651 m)   Wt 117.9 kg (260 lb)   LMP 06/02/2017   SpO2 96%   BMI 43.27 kg/m    Final Clinical Impressions(s) / ED Diagnoses   Final diagnoses:  Viral pharyngitis    New Prescriptions New Prescriptions   CHLORHEXIDINE (PERIDEX) 0.12 % SOLUTION    Use as directed 15 mLs in the mouth or throat 2 (two) times daily.   IBUPROFEN (ADVIL,MOTRIN) 600 MG TABLET    Take 1 tablet (600 mg total) by mouth every 6 (six) hours as needed.   Pt symptoms consistent with URI, viral pharyngitis. Pt will be discharged with symptomatic treatment.  Discussed return precautions.  Pt is hemodynamically stable & in NAD prior to discharge.    Fayrene Helper, PA-C 06/03/17 1610    Maia Plan, MD 06/03/17 [phone]

## 2017-06-05 LAB — CULTURE, GROUP A STREP (THRC)

## 2018-09-28 ENCOUNTER — Emergency Department (HOSPITAL_COMMUNITY)
Admission: EM | Admit: 2018-09-28 | Discharge: 2018-09-29 | Disposition: A | Discharge status: Home / Self Care | Payer: Medicaid Other | Attending: Emergency Medicine | Admitting: Emergency Medicine

## 2018-09-28 ENCOUNTER — Encounter: Payer: Self-pay | Admitting: Emergency Medicine

## 2018-09-28 DIAGNOSIS — Z87891 Personal history of nicotine dependence: Secondary | ICD-10-CM | POA: Diagnosis not present

## 2018-09-28 DIAGNOSIS — X500XXA Overexertion from strenuous movement or load, initial encounter: Secondary | ICD-10-CM | POA: Diagnosis not present

## 2018-09-28 DIAGNOSIS — Y929 Unspecified place or not applicable: Secondary | ICD-10-CM | POA: Insufficient documentation

## 2018-09-28 DIAGNOSIS — S3992XA Unspecified injury of lower back, initial encounter: Secondary | ICD-10-CM | POA: Diagnosis present

## 2018-09-28 DIAGNOSIS — Y998 Other external cause status: Secondary | ICD-10-CM | POA: Diagnosis not present

## 2018-09-28 DIAGNOSIS — E039 Hypothyroidism, unspecified: Secondary | ICD-10-CM | POA: Insufficient documentation

## 2018-09-28 DIAGNOSIS — Z79899 Other long term (current) drug therapy: Secondary | ICD-10-CM | POA: Diagnosis not present

## 2018-09-28 DIAGNOSIS — S39012A Strain of muscle, fascia and tendon of lower back, initial encounter: Secondary | ICD-10-CM | POA: Diagnosis not present

## 2018-09-28 DIAGNOSIS — Y9389 Activity, other specified: Secondary | ICD-10-CM | POA: Insufficient documentation

## 2018-09-28 NOTE — ED Notes (Signed)
Bed: WA01 Expected date:  Expected time:  Means of arrival:  Comments: 

## 2018-09-28 NOTE — ED Triage Notes (Signed)
Pt complains of middle back pain since waking up this am, no injury noted, pt states that she thought she slept wrong but the pain is getting worse and it's starting to spasm

## 2018-09-29 MED ORDER — METHOCARBAMOL 500 MG PO TABS: 500.0000 mg | ORAL_TABLET | Freq: Three times a day (TID) | ORAL | 0 refills | Status: DC | PRN

## 2018-09-29 NOTE — ED Provider Notes (Signed)
[facility]-EMERGENCY DEPT Provider Note   CSN: [id number] Arrival date & time: 09/28/18  2200     History   Chief Complaint Chief Complaint  Patient presents with  . Back Pain    HPI Laketha Fu is a 38 y.o. female.  38 year old female presents to the emergency department for evaluation of back pain.  She states that she initially noticed pain upon waking from sleep this morning.  States that pain is aggravated with movement.  It was only mildly improved with ibuprofen, but patient feels that her symptoms are more due to a "spasm" in her back.  She works at [employer] and does occasional heavy lifting.  Denies any known trauma or injury.  She has not had any fevers, dysuria, hematuria, extremity numbness or paresthesias, extremity weakness, bowel or bladder incontinence.  Last menstrual period was 1 week ago.  The history is provided by the patient. No language interpreter was used.  Back Pain    Past Medical History:  Diagnosis Date  . H/O cone biopsy of cervix   . Hypothyroidism   . Preterm labor     Patient Active Problem List   Diagnosis Date Noted  . Status post primary low transverse cesarean section 02/05/2015  . Preterm premature rupture of membranes (PPROM) with unknown onset of labor 01/26/2015    Past Surgical History:  Procedure Laterality Date  . CESAREAN SECTION N/A 02/02/2015   Procedure: CESAREAN SECTION;  Surgeon: Myna Hidalgo, DO;  Location: [facility];  Service: Obstetrics;  Laterality: N/A;  . EYE SURGERY       OB History    Gravida  4   Para  2   Term      Preterm  2   AB  2   Living  2     SAB  2   TAB      Ectopic      Multiple  0   Live Births  2            Home Medications    Prior to Admission medications   Medication Sig Start Date End Date Taking? Authorizing Provider  chlorhexidine (PERIDEX) 0.12 % solution Use as directed 15 mLs in the mouth or throat 2 (two) times daily. 06/03/17    Fayrene Helper, PA-C  ibuprofen (ADVIL,MOTRIN) 600 MG tablet Take 1 tablet (600 mg total) by mouth every 6 (six) hours as needed. 06/03/17   Fayrene Helper, PA-C  levothyroxine (SYNTHROID, LEVOTHROID) 100 MCG tablet Take 100 mcg by mouth daily before breakfast.    [provider]  methocarbamol (ROBAXIN) 500 MG tablet Take 1 tablet (500 mg total) by mouth every 8 (eight) hours as needed for muscle spasms. 09/29/18   Antony Madura, PA-C  oxyCODONE-acetaminophen (PERCOCET/ROXICET) 5-325 MG per tablet Take 1 tablet by mouth every 4 (four) hours as needed (for pain scale 4-7). 02/05/15   Nigel Bridgeman, CNM  Prenatal Vit-Fe Fumarate-FA (PRENATAL MULTIVITAMIN) TABS tablet Take 1 tablet by mouth daily at 12 noon.    [provider]    Family History Family History  Problem Relation Age of Onset  . Diabetes Maternal Grandfather   . Asthma Sister   . Cancer Sister   . Cancer Maternal Grandmother   . Cancer Paternal Grandmother     Social History Social History   Tobacco Use  . Smoking status: Former Smoker    Packs/day: 1.00    Types: Cigarettes    Last attempt to quit: 08/19/2014  Years since quitting: 4.1  . Smokeless tobacco: Never Used  Substance Use Topics  . Alcohol use: No  . Drug use: No     Allergies   Sulfa antibiotics   Review of Systems Review of Systems  Musculoskeletal: Positive for back pain.  Ten systems reviewed and are negative for acute change, except as noted in the HPI.    Physical Exam Updated Vital Signs BP (!) 145/108 (BP Location: Left Arm)   Pulse 94   Temp 98.1 F (36.7 C) (Oral)   Resp 18   Ht 5\' 4"  (1.626 m)   Wt 127 kg   LMP 09/21/2018   SpO2 99%   BMI 48.06 kg/m   Physical Exam Vitals signs and nursing note reviewed.  Constitutional:      General: She is not in acute distress.    Appearance: She is well-developed. She is not diaphoretic.     Comments: Nontoxic appearing and in NAD  HENT:     Head: Normocephalic and  atraumatic.  Eyes:     General: No scleral icterus.    Conjunctiva/sclera: Conjunctivae normal.  Neck:     Musculoskeletal: Normal range of motion.  Cardiovascular:     Rate and Rhythm: Normal rate and regular rhythm.     Pulses: Normal pulses.  Pulmonary:     Effort: Pulmonary effort is normal. No respiratory distress.     Comments: Respirations even and unlabored Musculoskeletal: Normal range of motion.        General: Tenderness present.     Comments: Tenderness to palpation to the right upper lumbar paraspinal muscles.  No bony deformities, step-offs, crepitus to the lumbosacral midline.  Skin:    General: Skin is warm and dry.     Coloration: Skin is not pale.     Findings: No erythema or rash.  Neurological:     Mental Status: She is alert and oriented to person, place, and time.     Coordination: Coordination normal.     Comments: Sensation to light touch intact in bilateral lower extremities.  Psychiatric:        Behavior: Behavior normal.      ED Treatments / Results  Labs (all labs ordered are listed, but only abnormal results are displayed) Labs Reviewed - No data to display  EKG None  Radiology No results found.  Procedures Procedures (including critical care time)  Medications Ordered in ED Medications - No data to display   Initial Impression / Assessment and Plan / ED Course  I have reviewed the triage vital signs and the nursing notes.  Pertinent labs & imaging results that were available during my care of the patient were reviewed by me and considered in my medical decision making (see chart for details).     38 year old female presents to the emergency department for evaluation of back pain.  She is neurovascularly intact with reproducible pain to her right upper lumbar paraspinal muscles.  No history of trauma or injury.  No red flags or signs concerning for cauda equina.  Will manage symptomatically with ibuprofen and Robaxin.  Return  precautions discussed and provided. Patient discharged in stable condition with no unaddressed concerns.   Final Clinical Impressions(s) / ED Diagnoses   Final diagnoses:  Strain of lumbar region, initial encounter    ED Discharge Orders         Ordered    methocarbamol (ROBAXIN) 500 MG tablet  Every 8 hours PRN     09/29/18 0015  Antony MaduraHumes, Rossie Scarfone, PA-C 09/29/18 0053    Nira Connardama, Pedro Eduardo, MD 09/29/18 [phone]

## 2018-09-29 NOTE — Discharge Instructions (Signed)
Alternate ice and heat to areas of injury 3-4 times per day to limit inflammation and spasm.  Avoid strenuous activity and heavy lifting.  We recommend consistent use of naproxen or ibuprofen in addition to Robaxin for muscle spasms.  Do not drive or drink alcohol after taking Robaxin as it may make you drowsy and impair your judgment.  We recommend follow-up with a primary care doctor to ensure resolution of symptoms.  Return to the ED for any new or concerning symptoms.

## 2019-02-27 ENCOUNTER — Emergency Department (HOSPITAL_COMMUNITY)
Admission: EM | Admit: 2019-02-27 | Discharge: 2019-02-27 | Disposition: A | Discharge status: Home / Self Care | Payer: Medicaid Other | Attending: Emergency Medicine | Admitting: Emergency Medicine

## 2019-02-27 ENCOUNTER — Other Ambulatory Visit: Payer: Self-pay

## 2019-02-27 DIAGNOSIS — E039 Hypothyroidism, unspecified: Secondary | ICD-10-CM | POA: Insufficient documentation

## 2019-02-27 DIAGNOSIS — Z79899 Other long term (current) drug therapy: Secondary | ICD-10-CM | POA: Diagnosis not present

## 2019-02-27 DIAGNOSIS — R319 Hematuria, unspecified: Secondary | ICD-10-CM | POA: Diagnosis present

## 2019-02-27 DIAGNOSIS — N3001 Acute cystitis with hematuria: Secondary | ICD-10-CM

## 2019-02-27 DIAGNOSIS — Z87891 Personal history of nicotine dependence: Secondary | ICD-10-CM | POA: Diagnosis not present

## 2019-02-27 LAB — URINALYSIS, ROUTINE W REFLEX MICROSCOPIC
Bilirubin Urine: NEGATIVE
Glucose, UA: NEGATIVE mg/dL
Ketones, ur: NEGATIVE mg/dL
Nitrite: NEGATIVE
Protein, ur: NEGATIVE mg/dL
Specific Gravity, Urine: 1.027 (ref 1.005–1.030)
pH: 5 (ref 5.0–8.0)

## 2019-02-27 LAB — PREGNANCY, URINE: Preg Test, Ur: NEGATIVE

## 2019-02-27 MED ORDER — CEFTRIAXONE SODIUM 1 G IJ SOLR
500.0000 mg | Freq: Once | INTRAMUSCULAR | Status: AC
  Administered 2019-02-27: 500 mg via INTRAMUSCULAR
  Filled 2019-02-27: qty 10

## 2019-02-27 MED ORDER — CEPHALEXIN 500 MG PO CAPS: 500.0000 mg | ORAL_CAPSULE | Freq: Four times a day (QID) | ORAL | 0 refills | Status: AC

## 2019-02-27 MED ORDER — CEPHALEXIN 250 MG PO CAPS
250.0000 mg | ORAL_CAPSULE | Freq: Once | ORAL | Status: AC
  Administered 2019-02-27: 250 mg via ORAL
  Filled 2019-02-27: qty 1

## 2019-02-27 MED ORDER — ONDANSETRON 4 MG PO TBDP: 4.0000 mg | ORAL_TABLET | Freq: Three times a day (TID) | ORAL | 0 refills | Status: DC | PRN

## 2019-02-27 MED ORDER — ONDANSETRON 4 MG PO TBDP
4.0000 mg | ORAL_TABLET | Freq: Once | ORAL | Status: AC
  Administered 2019-02-27: 4 mg via ORAL
  Filled 2019-02-27: qty 1

## 2019-02-27 NOTE — Discharge Instructions (Signed)
Take zofran for nausea.   Stop taking Macrobid and instead switch to taking Keflex.   Make sure you are staying hydrated and drinking plenty of fluids.   Return to the Emergency Dept for any fever, abdominal pain, persistent vomiting, unable to eat or drink anything or any other worsening or concerning symptoms.

## 2019-02-27 NOTE — ED Notes (Signed)
No reaction to medication noted alert and oriented x 3 call light in reach no respiratory or acute distress noted. 

## 2019-02-27 NOTE — ED Provider Notes (Signed)
[facility]-EMERGENCY DEPT Provider Note   CSN: [id number] Arrival date & time: 02/27/19  1619    History   Chief Complaint Chief Complaint  Patient presents with  . Urinary Tract Infection    HPI Autumn Mclaughlin is a 38 y.o. female who presents for evaluation of hematuria and dysuria that is been ongoing for the last 9 days.  She reports that about 9 days ago, she for started having some dysuria.  She reports taking some over-the-counter Azo which helped improved her symptoms.  She reports about 4 days ago, she started having symptoms again and went to her primary care doctor.  They diagnosed with a urinary tract infection and started her on Macrobid which she has been taking yesterday and today.  She reports that today at work, she took the antibiotic and immediately after, she had one episode of vomiting.  She states she still has dysuria as well as hematuria that began today.  Patient states that she has not had any other vomiting and has been able to eat and drink appropriately.  She denies any fevers, abdominal pain, vaginal discharge.  She is currently sexually active with one partner.  They do not use protection.  Patient states she is not concerned about STDs.     The history is provided by the patient.    Past Medical History:  Diagnosis Date  . H/O cone biopsy of cervix   . Hypothyroidism   . Preterm labor     Patient Active Problem List   Diagnosis Date Noted  . Status post primary low transverse cesarean section 02/05/2015  . Preterm premature rupture of membranes (PPROM) with unknown onset of labor 01/26/2015    Past Surgical History:  Procedure Laterality Date  . CESAREAN SECTION N/A 02/02/2015   Procedure: CESAREAN SECTION;  Surgeon: Myna HidalgoJennifer Ozan, DO;  Location: [facility];  Service: Obstetrics;  Laterality: N/A;  . EYE SURGERY       OB History    Gravida  4   Para  2   Term      Preterm  2   AB  2   Living  2     SAB  2   TAB      Ectopic      Multiple  0   Live Births  2            Home Medications    Prior to Admission medications   Medication Sig Start Date End Date Taking? Authorizing Provider  cephALEXin (KEFLEX) 500 MG capsule Take 1 capsule (500 mg total) by mouth 4 (four) times daily for 7 days. 02/27/19 03/06/19  Maxwell CaulLayden, Lindsey A, PA-C  chlorhexidine (PERIDEX) 0.12 % solution Use as directed 15 mLs in the mouth or throat 2 (two) times daily. 06/03/17   Fayrene Helperran, Bowie, PA-C  ibuprofen (ADVIL,MOTRIN) 600 MG tablet Take 1 tablet (600 mg total) by mouth every 6 (six) hours as needed. 06/03/17   Fayrene Helperran, Bowie, PA-C  levothyroxine (SYNTHROID, LEVOTHROID) 100 MCG tablet Take 100 mcg by mouth daily before breakfast.    [provider]  methocarbamol (ROBAXIN) 500 MG tablet Take 1 tablet (500 mg total) by mouth every 8 (eight) hours as needed for muscle spasms. 09/29/18   Antony MaduraHumes, Kelly, PA-C  ondansetron (ZOFRAN ODT) 4 MG disintegrating tablet Take 1 tablet (4 mg total) by mouth every 8 (eight) hours as needed for nausea or vomiting. 02/27/19   Maxwell CaulLayden, Lindsey A, PA-C  oxyCODONE-acetaminophen (PERCOCET/ROXICET) 5-325 MG  per tablet Take 1 tablet by mouth every 4 (four) hours as needed (for pain scale 4-7). 02/05/15   Donnel Saxon, CNM  Prenatal Vit-Fe Fumarate-FA (PRENATAL MULTIVITAMIN) TABS tablet Take 1 tablet by mouth daily at 12 noon.    [provider]    Family History Family History  Problem Relation Age of Onset  . Diabetes Maternal Grandfather   . Asthma Sister   . Cancer Sister   . Cancer Maternal Grandmother   . Cancer Paternal Grandmother     Social History Social History   Tobacco Use  . Smoking status: Former Smoker    Packs/day: 1.00    Types: Cigarettes    Quit date: 08/19/2014    Years since quitting: 4.5  . Smokeless tobacco: Never Used  Substance Use Topics  . Alcohol use: No  . Drug use: No     Allergies   Sulfa antibiotics   Review of Systems Review  of Systems  Constitutional: Negative for fever.  Respiratory: Negative for shortness of breath.   Cardiovascular: Negative for chest pain.  Gastrointestinal: Positive for vomiting. Negative for abdominal pain and nausea.  Genitourinary: Positive for dysuria and hematuria. Negative for vaginal bleeding and vaginal discharge.  All other systems reviewed and are negative.    Physical Exam Updated Vital Signs BP (!) 141/92   Pulse 72   Temp 98.5 F (36.9 C) (Oral)   Resp 18   Ht 5\' 4"  (1.626 m)   Wt 129.3 kg   SpO2 97%   BMI 48.92 kg/m   Physical Exam Vitals signs and nursing note reviewed.  Constitutional:      Appearance: Normal appearance. She is well-developed.     Comments: Sitting comfortably on bed  HENT:     Head: Normocephalic and atraumatic.  Eyes:     General: Lids are normal.     Conjunctiva/sclera: Conjunctivae normal.     Pupils: Pupils are equal, round, and reactive to light.  Neck:     Musculoskeletal: Full passive range of motion without pain.  Cardiovascular:     Rate and Rhythm: Normal rate and regular rhythm.     Pulses: Normal pulses.     Heart sounds: Normal heart sounds. No murmur. No friction rub. No gallop.   Pulmonary:     Effort: Pulmonary effort is normal.     Breath sounds: Normal breath sounds.  Abdominal:     Palpations: Abdomen is soft. Abdomen is not rigid.     Tenderness: There is no abdominal tenderness. There is no guarding.     Comments: Abdomen is soft, non-distended, non-tender. No rigidity, No guarding. No peritoneal signs. No CVA tenderness bilaterally.  Musculoskeletal: Normal range of motion.  Skin:    General: Skin is warm and dry.     Capillary Refill: Capillary refill takes less than 2 seconds.  Neurological:     Mental Status: She is alert and oriented to person, place, and time.  Psychiatric:        Speech: Speech normal.      ED Treatments / Results  Labs (all labs ordered are listed, but only abnormal results  are displayed) Labs Reviewed  URINALYSIS, ROUTINE W REFLEX MICROSCOPIC - Abnormal; Notable for the following components:      Result Value   APPearance HAZY (*)    Hgb urine dipstick LARGE (*)    Leukocytes,Ua LARGE (*)    Bacteria, UA RARE (*)    All other components within normal limits  URINE  CULTURE  PREGNANCY, URINE    EKG None  Radiology No results found.  Procedures Procedures (including critical care time)  Medications Ordered in ED Medications  ondansetron (ZOFRAN-ODT) disintegrating tablet 4 mg (4 mg Oral Given 02/27/19 1813)  cefTRIAXone (ROCEPHIN) injection 500 mg (500 mg Intramuscular Given 02/27/19 1912)  cephALEXin (KEFLEX) capsule 250 mg (250 mg Oral Given 02/27/19 1912)     Initial Impression / Assessment and Plan / ED Course  I have reviewed the triage vital signs and the nursing notes.  Pertinent labs & imaging results that were available during my care of the patient were reviewed by me and considered in my medical decision making (see chart for details).        38 y.o. F who presents for evaluation of dysuria and hematuria that is been ongoing days.  She reports initially taking Azo and the doctor prescribed her Macrobid which she has taken for 2 days.  Comes in today with continued dysuria and hematuria.  Does report she had an episode of vomiting today at work after taking antibiotic.  Neither vomiting since.  She has been afebrile and has been able to tolerate p.o. Patient is afebrile, non-toxic appearing, sitting comfortably on examination table. Vital signs reviewed and stable.  On exam, she has no abdominal tenderness.  No CVA tenderness.  She has no vaginal bleeding, vaginal discharge.  That her UTI.  Doubt pyelonephritis at this time but also consideration.  Plan to check urine and reassess.  UA shows large hemoglobin, large leukocytes,.  There is squamous epithelium so question contaminant but will go ahead and treat since she is symptomatic.  Urine  culture is sent.  Reevaluation.  Patient is resting comfortably.  Vitals are stable.  She has no abdominal tenderness or no CVA tenderness that would make me concerned that this is a kidney stone.  Additionally, she has no CVA tenderness and is afebrile here in the ED.  Patient was p.o. challenge and was able to tolerate p.o. without any vomiting.  Her symptoms could indicate early pyelonephritis but she is tolerating p.o. and is hemodynamically stable.  At this time, I feel that her symptoms can be managed on an outpatient basis.  We will plan to switch her to Keflex.  Urine culture sent.  Patient given dose of Rocephin and Keflex here in the ED.  Discussed plan with patient.  She is agreeable.  Encourage patient to come back to the emergency department if she experiences any abdominal pain, persistent vomiting, fevers. At this time, patient exhibits no emergent life-threatening condition that require further evaluation in ED or admission. Patient had ample opportunity for questions and discussion. All patient's questions were answered with full understanding. Strict return precautions discussed. Patient expresses understanding and agreement to plan.   Portions of this note were generated with Scientist, clinical (histocompatibility and immunogenetics)Dragon dictation software. Dictation errors may occur despite best attempts at proofreading.    Final Clinical Impressions(s) / ED Diagnoses   Final diagnoses:  Acute cystitis with hematuria    ED Discharge Orders         Ordered    cephALEXin (KEFLEX) 500 MG capsule  4 times daily     02/27/19 1852    ondansetron (ZOFRAN ODT) 4 MG disintegrating tablet  Every 8 hours PRN     02/27/19 1852           Maxwell CaulLayden, Lindsey A, PA-C 02/27/19 2200    Virgina Norfolkuratolo, Adam, DO 02/27/19 2247

## 2019-02-27 NOTE — ED Triage Notes (Signed)
States has been having a UTI for about a week and got a antibiotic yesterday and now vomiting and doesn't feel right, and dysuria voiced.

## 2019-03-01 LAB — URINE CULTURE: Culture: NO GROWTH

## 2019-03-07 ENCOUNTER — Emergency Department (HOSPITAL_COMMUNITY)
Admission: EM | Admit: 2019-03-07 | Discharge: 2019-03-07 | Disposition: A | Discharge status: Home / Self Care | Payer: Medicaid Other | Attending: Emergency Medicine | Admitting: Emergency Medicine

## 2019-03-07 ENCOUNTER — Encounter (HOSPITAL_COMMUNITY): Payer: Self-pay

## 2019-03-07 ENCOUNTER — Other Ambulatory Visit: Payer: Self-pay

## 2019-03-07 DIAGNOSIS — Z882 Allergy status to sulfonamides status: Secondary | ICD-10-CM | POA: Insufficient documentation

## 2019-03-07 DIAGNOSIS — Z79899 Other long term (current) drug therapy: Secondary | ICD-10-CM | POA: Diagnosis not present

## 2019-03-07 DIAGNOSIS — E039 Hypothyroidism, unspecified: Secondary | ICD-10-CM | POA: Insufficient documentation

## 2019-03-07 DIAGNOSIS — Z87891 Personal history of nicotine dependence: Secondary | ICD-10-CM | POA: Insufficient documentation

## 2019-03-07 DIAGNOSIS — N3000 Acute cystitis without hematuria: Secondary | ICD-10-CM | POA: Diagnosis not present

## 2019-03-07 DIAGNOSIS — R300 Dysuria: Secondary | ICD-10-CM | POA: Diagnosis present

## 2019-03-07 LAB — URINALYSIS, ROUTINE W REFLEX MICROSCOPIC
Bacteria, UA: NONE SEEN
Bilirubin Urine: NEGATIVE
Glucose, UA: NEGATIVE mg/dL
Ketones, ur: NEGATIVE mg/dL
Nitrite: NEGATIVE
Protein, ur: NEGATIVE mg/dL
Specific Gravity, Urine: 1.024 (ref 1.005–1.030)
pH: 5 (ref 5.0–8.0)

## 2019-03-07 LAB — POC URINE PREG, ED: Preg Test, Ur: NEGATIVE

## 2019-03-07 MED ORDER — CIPROFLOXACIN HCL 500 MG PO TABS: 500.0000 mg | ORAL_TABLET | Freq: Two times a day (BID) | ORAL | 0 refills | Status: DC

## 2019-03-07 NOTE — ED Notes (Signed)
C/o burning with ? Blood in urine after completing a week of antibiotics. States she is having some lower pressure on left side near groin area. On period and states she could see blood present prior to starting.

## 2019-03-07 NOTE — ED Provider Notes (Signed)
[facility]-EMERGENCY DEPT Provider Note   CSN: [id number] Arrival date & time: 03/07/19  1817     History   Chief Complaint Chief Complaint  Patient presents with  . Urinary Tract Infection    HPI Faythe CasaMerissa Middlesworth is a 38 y.o. female.     Patient is a 38 year old female with no significant past medical history.  She presents today for evaluation of dysuria.  She was treated 1 week ago for a urinary tract infection with Keflex, however her symptoms persist.  She denies any fevers or chills.  She denies any vaginal discharge.  Her last menstrual period was last week and normal.  She denies the possibility of pregnancy.  She has no new sexual partners and has been in a monogamous relationship for the past 8 years.  The history is provided by the patient.  Urinary Tract Infection Pain quality:  Burning Pain severity:  Moderate Onset quality:  Gradual Duration:  1 week Timing:  Constant Progression:  Unchanged Chronicity:  New Worsened by:  Nothing Ineffective treatments:  Antibiotics Associated symptoms: no abdominal pain, no fever and no flank pain     Past Medical History:  Diagnosis Date  . H/O cone biopsy of cervix   . Hypothyroidism   . Preterm labor     Patient Active Problem List   Diagnosis Date Noted  . Status post primary low transverse cesarean section 02/05/2015  . Preterm premature rupture of membranes (PPROM) with unknown onset of labor 01/26/2015    Past Surgical History:  Procedure Laterality Date  . CESAREAN SECTION N/A 02/02/2015   Procedure: CESAREAN SECTION;  Surgeon: Myna HidalgoJennifer Ozan, DO;  Location: [facility];  Service: Obstetrics;  Laterality: N/A;  . EYE SURGERY       OB History    Gravida  4   Para  2   Term      Preterm  2   AB  2   Living  2     SAB  2   TAB      Ectopic      Multiple  0   Live Births  2            Home Medications    Prior to Admission medications   Medication Sig Start  Date End Date Taking? Authorizing Provider  chlorhexidine (PERIDEX) 0.12 % solution Use as directed 15 mLs in the mouth or throat 2 (two) times daily. 06/03/17   Fayrene Helperran, Bowie, PA-C  ibuprofen (ADVIL,MOTRIN) 600 MG tablet Take 1 tablet (600 mg total) by mouth every 6 (six) hours as needed. 06/03/17   Fayrene Helperran, Bowie, PA-C  levothyroxine (SYNTHROID, LEVOTHROID) 100 MCG tablet Take 100 mcg by mouth daily before breakfast.    [provider]  methocarbamol (ROBAXIN) 500 MG tablet Take 1 tablet (500 mg total) by mouth every 8 (eight) hours as needed for muscle spasms. 09/29/18   Antony MaduraHumes, Kelly, PA-C  ondansetron (ZOFRAN ODT) 4 MG disintegrating tablet Take 1 tablet (4 mg total) by mouth every 8 (eight) hours as needed for nausea or vomiting. 02/27/19   Maxwell CaulLayden, Lindsey A, PA-C  oxyCODONE-acetaminophen (PERCOCET/ROXICET) 5-325 MG per tablet Take 1 tablet by mouth every 4 (four) hours as needed (for pain scale 4-7). 02/05/15   Nigel BridgemanLatham, Vicki, CNM  Prenatal Vit-Fe Fumarate-FA (PRENATAL MULTIVITAMIN) TABS tablet Take 1 tablet by mouth daily at 12 noon.    [provider]    Family History Family History  Problem Relation Age of Onset  .  Diabetes Maternal Grandfather   . Asthma Sister   . Cancer Sister   . Cancer Maternal Grandmother   . Cancer Paternal Grandmother     Social History Social History   Tobacco Use  . Smoking status: Former Smoker    Packs/day: 1.00    Types: Cigarettes    Quit date: 08/19/2014    Years since quitting: 4.5  . Smokeless tobacco: Never Used  Substance Use Topics  . Alcohol use: No  . Drug use: No     Allergies   Sulfa antibiotics   Review of Systems Review of Systems  Constitutional: Negative for fever.  Gastrointestinal: Negative for abdominal pain.  Genitourinary: Negative for flank pain.  All other systems reviewed and are negative.    Physical Exam Updated Vital Signs BP (!) 139/100 (BP Location: Right Arm)   Pulse 80   Temp 98.4 F  (36.9 C) (Oral)   Resp 18   LMP 02/17/2019 (Approximate)   SpO2 100%   Physical Exam Vitals signs and nursing note reviewed.  Constitutional:      General: She is not in acute distress.    Appearance: She is well-developed. She is not diaphoretic.  HENT:     Head: Normocephalic and atraumatic.  Neck:     Musculoskeletal: Normal range of motion and neck supple.  Cardiovascular:     Rate and Rhythm: Normal rate and regular rhythm.     Heart sounds: No murmur. No friction rub. No gallop.   Pulmonary:     Effort: Pulmonary effort is normal. No respiratory distress.     Breath sounds: Normal breath sounds. No wheezing.  Abdominal:     General: Bowel sounds are normal. There is no distension.     Palpations: Abdomen is soft.     Tenderness: There is no abdominal tenderness.  Musculoskeletal: Normal range of motion.  Skin:    General: Skin is warm and dry.  Neurological:     Mental Status: She is alert and oriented to person, place, and time.      ED Treatments / Results  Labs (all labs ordered are listed, but only abnormal results are displayed) Labs Reviewed  URINALYSIS, ROUTINE W REFLEX MICROSCOPIC - Abnormal; Notable for the following components:      Result Value   APPearance HAZY (*)    Hgb urine dipstick LARGE (*)    Leukocytes,Ua LARGE (*)    All other components within normal limits  POC URINE PREG, ED    EKG None  Radiology No results found.  Procedures Procedures (including critical care time)  Medications Ordered in ED Medications - No data to display   Initial Impression / Assessment and Plan / ED Course  I have reviewed the triage vital signs and the nursing notes.  Pertinent labs & imaging results that were available during my care of the patient were reviewed by me and considered in my medical decision making (see chart for details).  Urinalysis consistent with urinary tract infection.  Patient will be treated with this time with Cipro and a  urine culture will be obtained.  To return as needed for any problems.  Final Clinical Impressions(s) / ED Diagnoses   Final diagnoses:  None    ED Discharge Orders    None       Veryl Speak, MD 03/07/19 2016

## 2019-03-07 NOTE — ED Triage Notes (Signed)
C/o persistent bladder area discomfort.  She states she was seen here a week ago, and has taken all of the antibiotic. She denies fever and is in no distress.

## 2019-03-07 NOTE — Discharge Instructions (Addendum)
Cipro as prescribed.  Drink plenty of fluids.  Return to the emergency department if you develop high fever, severe abdominal pain, or significantly worsening symptoms.

## 2019-03-09 LAB — URINE CULTURE
Culture: NO GROWTH
Special Requests: NORMAL

## 2019-08-27 ENCOUNTER — Ambulatory Visit
Admission: EM | Admit: 2019-08-27 | Discharge: 2019-08-27 | Disposition: A | Discharge status: Home / Self Care | Payer: Medicaid Other

## 2019-08-27 ENCOUNTER — Other Ambulatory Visit: Payer: Self-pay

## 2019-08-27 DIAGNOSIS — M545 Low back pain, unspecified: Secondary | ICD-10-CM

## 2019-08-27 MED ORDER — METHOCARBAMOL 500 MG PO TABS: 500.0000 mg | ORAL_TABLET | Freq: Two times a day (BID) | ORAL | 0 refills | Status: DC
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
## 2019-09-08 ENCOUNTER — Ambulatory Visit
Admission: EM | Admit: 2019-09-08 | Discharge: 2019-09-08 | Disposition: A | Discharge status: Home / Self Care | Payer: Medicaid Other | Attending: Emergency Medicine | Admitting: Emergency Medicine

## 2019-09-08 DIAGNOSIS — R059 Cough, unspecified: Secondary | ICD-10-CM

## 2019-09-08 DIAGNOSIS — R05 Cough: Secondary | ICD-10-CM

## 2019-09-08 DIAGNOSIS — M545 Low back pain, unspecified: Secondary | ICD-10-CM

## 2019-09-08 DIAGNOSIS — Z20822 Contact with and (suspected) exposure to covid-19: Secondary | ICD-10-CM | POA: Diagnosis not present

## 2019-09-08 MED ORDER — MELOXICAM 7.5 MG PO TABS: 7.5000 mg | ORAL_TABLET | Freq: Every day | ORAL | 0 refills | Status: DC
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
## 2019-09-08 NOTE — Discharge Instructions (Signed)
Your COVID test is pending - it is important to quarantine / isolate at home until your results are back. °If you test positive and would like further evaluation for persistent or worsening symptoms, you may schedule an E-visit or virtual (video) visit throughout the [facility] MyChart app or website. ° °PLEASE NOTE: If you develop severe chest pain or shortness of breath please go to the ER or call 9-1-1 for further evaluation --> DO NOT schedule electronic or virtual visits for this. °Please call our office for further guidance / recommendations as needed. ° °For information about the Covid vaccine, please visit [email or web address] °

## 2019-09-09 LAB — NOVEL CORONAVIRUS, NAA: SARS-CoV-2, NAA: DETECTED — AB

## 2019-09-10 ENCOUNTER — Telehealth: Payer: Self-pay | Admitting: Unknown Physician Specialty

## 2019-09-10 NOTE — Telephone Encounter (Signed)
Called to discuss with patient about Covid symptoms and the use of bamlanivimab, a monoclonal antibody infusion for those with mild to moderate Covid symptoms and at a high risk of hospitalization.  Pt is qualified for this infusion at the [facility] infusion center due to BMI>35   She will think about it.  Penciled in for 1/25 Sx onset 1/22

## 2019-09-10 NOTE — Telephone Encounter (Signed)
Was on the fence for mab infusion.  Called to follow-up

## 2020-01-15 ENCOUNTER — Emergency Department (HOSPITAL_COMMUNITY)
Admission: EM | Admit: 2020-01-15 | Discharge: 2020-01-15 | Disposition: A | Discharge status: Home / Self Care | Payer: Medicaid Other | Attending: Emergency Medicine | Admitting: Emergency Medicine

## 2020-01-15 ENCOUNTER — Other Ambulatory Visit: Payer: Self-pay

## 2020-01-15 DIAGNOSIS — E039 Hypothyroidism, unspecified: Secondary | ICD-10-CM | POA: Diagnosis not present

## 2020-01-15 DIAGNOSIS — F17210 Nicotine dependence, cigarettes, uncomplicated: Secondary | ICD-10-CM | POA: Insufficient documentation

## 2020-01-15 DIAGNOSIS — Z79899 Other long term (current) drug therapy: Secondary | ICD-10-CM | POA: Insufficient documentation

## 2020-01-15 DIAGNOSIS — M25561 Pain in right knee: Secondary | ICD-10-CM

## 2020-01-15 MED ORDER — METHOCARBAMOL 500 MG PO TABS: 500.0000 mg | ORAL_TABLET | Freq: Two times a day (BID) | ORAL | 0 refills | Status: DC | PRN

## 2020-01-15 MED ORDER — KETOROLAC TROMETHAMINE 30 MG/ML IJ SOLN
30.0000 mg | Freq: Once | INTRAMUSCULAR | Status: AC
  Administered 2020-01-15: 30 mg via INTRAMUSCULAR
  Filled 2020-01-15: qty 1
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
## 2020-01-15 NOTE — Discharge Instructions (Addendum)
Thank you for allowing me to care for you today in the Emergency Department.   Wear the knee sleeve to provide compression on the right knee to help with pain.  Take 650 mg of Tylenol once every 6 hours to help with pain.  You can also try an over-the-counter muscle cream to help with your symptoms.  Take 1 tablet of Robaxin up to 2 times daily to help with muscle pain.  Do not work or drive while taking this medication until you know how it impacts you.  When you are sitting and resting, try to elevate your right leg so that your toes are at or above the level of your nose.  Apply ice pack for 15 to 20 minutes up to 3-4 times a day for the next 5 days to help with your symptoms.  Try to stretch the muscles around your knee to help strengthen the joint.  Try not to lock your knee when you are standing as this may make your symptoms worse.  Return to the emergency department if you develop fevers, chills, if the knee gets red, hot to the touch, swollen, if you become unable to walk, or develop other new, concerning symptoms.

## 2020-01-15 NOTE — ED Notes (Signed)
Patient ambulated to restroom, gait steady. States her knee is throbbing intermittently.

## 2020-02-24 ENCOUNTER — Other Ambulatory Visit: Payer: Self-pay

## 2020-02-24 ENCOUNTER — Emergency Department (HOSPITAL_COMMUNITY)
Admission: EM | Admit: 2020-02-24 | Discharge: 2020-02-24 | Disposition: A | Discharge status: Home / Self Care | Payer: Medicaid Other | Attending: Emergency Medicine | Admitting: Emergency Medicine

## 2020-02-24 ENCOUNTER — Emergency Department (HOSPITAL_COMMUNITY): Payer: Medicaid Other

## 2020-02-24 ENCOUNTER — Encounter (HOSPITAL_COMMUNITY): Payer: Self-pay

## 2020-02-24 DIAGNOSIS — F17210 Nicotine dependence, cigarettes, uncomplicated: Secondary | ICD-10-CM | POA: Diagnosis not present

## 2020-02-24 DIAGNOSIS — R109 Unspecified abdominal pain: Secondary | ICD-10-CM | POA: Diagnosis present

## 2020-02-24 DIAGNOSIS — E039 Hypothyroidism, unspecified: Secondary | ICD-10-CM | POA: Insufficient documentation

## 2020-02-24 DIAGNOSIS — R1031 Right lower quadrant pain: Secondary | ICD-10-CM | POA: Diagnosis not present

## 2020-02-24 DIAGNOSIS — R42 Dizziness and giddiness: Secondary | ICD-10-CM | POA: Diagnosis not present

## 2020-02-24 LAB — URINALYSIS, MICROSCOPIC (REFLEX)
Bacteria, UA: NONE SEEN
WBC, UA: NONE SEEN WBC/hpf (ref 0–5)

## 2020-02-24 LAB — URINALYSIS, ROUTINE W REFLEX MICROSCOPIC
Bilirubin Urine: NEGATIVE
Glucose, UA: NEGATIVE mg/dL
Ketones, ur: NEGATIVE mg/dL
Leukocytes,Ua: NEGATIVE
Nitrite: NEGATIVE
Protein, ur: NEGATIVE mg/dL
Specific Gravity, Urine: 1.025 (ref 1.005–1.030)
pH: 6 (ref 5.0–8.0)

## 2020-02-24 LAB — I-STAT BETA HCG BLOOD, ED (MC, WL, AP ONLY): I-stat hCG, quantitative: <5 m[IU]/mL (ref <5)

## 2020-02-24 LAB — CBC
HCT: 47.2 % — ABNORMAL HIGH (ref 36.0–46.0)
Hemoglobin: 16 g/dL — ABNORMAL HIGH (ref 12.0–15.0)
MCH: 30.6 pg (ref 26.0–34.0)
MCHC: 33.9 g/dL (ref 30.0–36.0)
MCV: 90.2 fL (ref 80.0–100.0)
Platelets: 211 10*3/uL (ref 150–400)
RBC: 5.23 MIL/uL — ABNORMAL HIGH (ref 3.87–5.11)
RDW: 12.2 % (ref 11.5–15.5)
WBC: 9.3 10*3/uL (ref 4.0–10.5)
nRBC: 0 % (ref 0.0–0.2)

## 2020-02-24 LAB — COMPREHENSIVE METABOLIC PANEL
ALT: 13 U/L (ref 0–44)
AST: 14 U/L — ABNORMAL LOW (ref 15–41)
Albumin: 4.2 g/dL (ref 3.5–5.0)
Alkaline Phosphatase: 89 U/L (ref 38–126)
Anion gap: 7 (ref 5–15)
BUN: 12 mg/dL (ref 6–20)
CO2: 28 mmol/L (ref 22–32)
Calcium: 9.1 mg/dL (ref 8.9–10.3)
Chloride: 104 mmol/L (ref 98–111)
Creatinine, Ser: 0.95 mg/dL (ref 0.44–1.00)
GFR calc Af Amer: >60 mL/min (ref >60)
GFR calc non Af Amer: >60 mL/min (ref >60)
Glucose, Bld: 101 mg/dL — ABNORMAL HIGH (ref 70–99)
Potassium: 4.5 mmol/L (ref 3.5–5.1)
Sodium: 139 mmol/L (ref 135–145)
Total Bilirubin: 1 mg/dL (ref 0.3–1.2)
Total Protein: 7.4 g/dL (ref 6.5–8.1)

## 2020-02-24 LAB — LIPASE, BLOOD: Lipase: 28 U/L (ref 11–51)

## 2020-02-24 MED ORDER — SODIUM CHLORIDE (PF) 0.9 % IJ SOLN
INTRAMUSCULAR | Status: AC
  Administered 2020-02-24: 3 mL via INTRAVENOUS
  Filled 2020-02-24: qty 50

## 2020-02-24 MED ORDER — IOHEXOL 300 MG/ML  SOLN
100.0000 mL | Freq: Once | INTRAMUSCULAR | Status: AC | PRN
  Administered 2020-02-24: 100 mL via INTRAVENOUS

## 2020-02-24 MED ORDER — SODIUM CHLORIDE 0.9% FLUSH
3.0000 mL | Freq: Once | INTRAVENOUS | Status: AC
  Administered 2020-02-24: 3 mL via INTRAVENOUS

## 2020-02-24 MED ORDER — SODIUM CHLORIDE 0.9 % IV BOLUS
1000.0000 mL | Freq: Once | INTRAVENOUS | Status: AC
  Administered 2020-02-24: 1000 mL via INTRAVENOUS

## 2020-02-24 NOTE — Discharge Instructions (Signed)
Please drink plenty of fluids Follow up with your OBGYN or PCP Return for worsening symptoms

## 2020-02-24 NOTE — ED Triage Notes (Signed)
Patient states she was told she had a right ovarian cyst. Patient states she began her period on 02/12/20 and states she has been having continuous light vaginal bleeding since. Patient also c/o dizziness.

## 2020-02-24 NOTE — ED Provider Notes (Signed)
[facility]-EMERGENCY DEPT Provider Note   CSN: [id number] Arrival date & time: 02/24/20  1243     History Chief Complaint  Patient presents with  . Abdominal Pain    Shreya Lacasse is a 39 y.o. 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 She states that she started to have RLQ pain at the end of June. She was concerned her IUD was out of place and went to her OBGYN on June 22nd. She sees Dr. Stark Klein at [facility]. They did a pelvic exam and TVUS which showed IUD was in place however she had a 3cm cyst. She was given reassurance that it was small and likely would resolved. She reports associated vaginal spotting. She also has been getting very lightheaded recently. This has been going on for a couple months but worse in the past couple weeks. It's worse when she goes from sitting to standing but she also gets lightheaded sometimes when she is just standing. She denies fever, chills, chest pain, SOB, N/V/D, urinary symptoms. She does have more abdominal pain when she has to urinate. Gastric bypass was done at [facility].   HPI   Past Medical History:  Diagnosis Date  . H/O cone biopsy of cervix   . Hypothyroidism   . Preterm labor     Patient Active Problem List   Diagnosis Date Noted  . Status post primary low transverse cesarean section 02/05/2015  . Preterm premature rupture of membranes (PPROM) with unknown onset of labor 01/26/2015    Past Surgical History:  Procedure Laterality Date  . CESAREAN SECTION N/A 02/02/2015   Procedure: CESAREAN SECTION;  Surgeon: Myna Hidalgo, DO;  Location: [facility];  Service: Obstetrics;  Laterality: N/A;  . EYE SURGERY    . GASTRIC BYPASS       OB History    Gravida  4   Para  2   Term      Preterm  2   AB  2   Living  2     SAB  2   TAB      Ectopic      Multiple  0   Live Births  2           Family History  Problem Relation Age  of Onset  . Diabetes Maternal Grandfather   . Asthma Sister   . Cancer Sister   . Cancer Maternal Grandmother   . Cancer Paternal Grandmother   . Thyroid disease Mother   . Cancer Father     Social History   Tobacco Use  . Smoking status: Current Every Day Smoker    Packs/day: 1.00    Types: Cigarettes    Last attempt to quit: 08/19/2014    Years since quitting: 5.5  . Smokeless tobacco: Never Used  Vaping Use  . Vaping Use: Never used  Substance Use Topics  . Alcohol use: No  . Drug use: No    Home Medications Prior to Admission medications   Medication Sig Start Date End Date Taking? Authorizing Provider  levothyroxine (SYNTHROID) 112 MCG tablet Take 112 mcg by mouth daily. 08/19/19   [provider]  methocarbamol (ROBAXIN) 500 MG tablet Take 1 tablet (500 mg total) by mouth 2 (two) times daily as needed for muscle spasms. 01/15/20   McDonald, Mia A, PA-C  Multiple Vitamins-Minerals (MULTIVITAMIN ADULTS PO) Take 1 tablet by mouth daily.    [provider]  vitamin  B-12 (CYANOCOBALAMIN) 100 MCG tablet Take 100 mcg by mouth daily.    [provider]    Allergies    Nsaids and Sulfa antibiotics  Review of Systems   Review of Systems  Constitutional: Negative for chills and fever.  Respiratory: Negative for shortness of breath.   Cardiovascular: Negative for chest pain.  Gastrointestinal: Positive for abdominal pain. Negative for constipation, diarrhea, nausea and vomiting.  Genitourinary: Positive for pelvic pain and vaginal bleeding. Negative for difficulty urinating, dysuria, flank pain and vaginal discharge.  Neurological: Positive for light-headedness. Negative for syncope.  All other systems reviewed and are negative.   Physical Exam Updated Vital Signs BP 108/73 (BP Location: Left Arm)   Pulse 75   Temp 98.8 F (37.1 C) (Oral)   Resp 18   Ht 5\' 5"  (1.651 m)   Wt 92.5 kg   SpO2 96%   BMI 33.95 kg/m   Physical Exam Vitals and  nursing note reviewed.  Constitutional:      General: She is not in acute distress.    Appearance: She is well-developed. She is obese. She is not ill-appearing.  HENT:     Head: Normocephalic and atraumatic.  Eyes:     General: No scleral icterus.       Right eye: No discharge.        Left eye: No discharge.     Conjunctiva/sclera: Conjunctivae normal.     Pupils: Pupils are equal, round, and reactive to light.  Cardiovascular:     Rate and Rhythm: Normal rate and regular rhythm.  Pulmonary:     Effort: Pulmonary effort is normal. No respiratory distress.     Breath sounds: Normal breath sounds.  Abdominal:     General: There is no distension.     Palpations: Abdomen is soft.     Tenderness: There is abdominal tenderness (rlq (mild)).  Genitourinary:    Comments: Pelvic: (deferred since just done by OBGYN) Musculoskeletal:     Cervical back: Normal range of motion.  Skin:    General: Skin is warm and dry.  Neurological:     Mental Status: She is alert and oriented to person, place, and time.  Psychiatric:        Behavior: Behavior normal.     ED Results / Procedures / Treatments   Labs (all labs ordered are listed, but only abnormal results are displayed) Labs Reviewed  COMPREHENSIVE METABOLIC PANEL - Abnormal; Notable for the following components:      Result Value   Glucose, Bld 101 (*)    AST 14 (*)    All other components within normal limits  CBC - Abnormal; Notable for the following components:   RBC 5.23 (*)    Hemoglobin 16.0 (*)    HCT 47.2 (*)    All other components within normal limits  URINALYSIS, ROUTINE W REFLEX MICROSCOPIC - Abnormal; Notable for the following components:   APPearance HAZY (*)    Hgb urine dipstick LARGE (*)    All other components within normal limits  LIPASE, BLOOD  URINALYSIS, MICROSCOPIC (REFLEX)  I-STAT BETA HCG BLOOD, ED ([facility], [facility], [facility] ONLY)    EKG None  Radiology CT Abdomen Pelvis W Contrast  Result Date:  02/24/2020 CLINICAL DATA:  39 year old female with right lower quadrant abdominal pain. EXAM: CT ABDOMEN AND PELVIS WITH CONTRAST TECHNIQUE: Multidetector CT imaging of the abdomen and pelvis was performed using the standard protocol following bolus administration of intravenous contrast. CONTRAST:  31 OMNIPAQUE IOHEXOL  300 MG/ML  SOLN COMPARISON:  CT abdomen pelvis dated 08/12/2008. FINDINGS: Lower chest: The visualized lung bases are clear. No intra-abdominal free air or free fluid. Hepatobiliary: No focal liver abnormality is seen. No gallstones, gallbladder wall thickening, or biliary dilatation. Pancreas: Unremarkable. No pancreatic ductal dilatation or surrounding inflammatory changes. Spleen: Normal in size without focal abnormality. Adrenals/Urinary Tract: The adrenal glands are unremarkable. The kidneys, visualized ureters, and urinary bladder appear unremarkable as well. Stomach/Bowel: There is postsurgical changes of gastric bypass. There is moderate stool throughout the colon. There is no bowel obstruction or active inflammation. The appendix is normal. Vascular/Lymphatic: The abdominal aorta and IVC are unremarkable. No portal venous gas. There is no adenopathy. Reproductive: The uterus is anteverted. An intrauterine device is noted. Probable 2.5 cm left ovarian dominant follicle or corpus luteum. The right ovary is unremarkable. Other: None Musculoskeletal: No acute osseous pathology. Several small sclerotic foci in the left iliac bone, nonspecific, possibly bone island. IMPRESSION: 1. No acute intra-abdominal or pelvic pathology. No bowel obstruction. Normal appendix. 2. Probable 2.5 cm left ovarian dominant follicle or corpus luteum. Electronically Signed   By: Elgie Collard M.D.   On: 02/24/2020 18:32    Procedures Procedures (including critical care time)  Medications Ordered in ED Medications  sodium chloride flush (NS) 0.9 % injection 3 mL (3 mLs Intravenous Given by Other 02/24/20  1843)  sodium chloride 0.9 % bolus 1,000 mL (0 mLs Intravenous Stopped 02/24/20 1915)  iohexol (OMNIPAQUE) 300 MG/ML solution 100 mL (100 mLs Intravenous Contrast Given 02/24/20 1750)    ED Course  I have reviewed the triage vital signs and the nursing notes.  Pertinent labs & imaging results that were available during my care of the patient were reviewed by me and considered in my medical decision making (see chart for details).  39 year old female presents with episodic lightheadedness for several weeks as well as RLQ/pelvic pain for a weeks as well which was previously diagnosed as an ovarian cyst. Her vitals are normal here. She is alert and oriented. Heart is regular rate and rhythm. Lungs are CTA. Abdomen is soft and there is very mild tenderness in RLQ. Labs obtained in triage shows mild polycythemia (hgb 16) - pt is a smoker. UA has large hgb which could be contamination from vaginal spotting. Pelvic exam was recently done by OBGYN so will defer today. Will obtain CT A/P due to hx of gastric bypass  CT is negative. There is resolution of the R ovarian cyst and now a small L ovarian cyst. Orthostatics were obtained and are negative. She was given 1L fluid. Discussed results with her and reassurance was given. She has a f/u appt with OBGYN next week and was encouraged to follow up or return if worsening.  MDM Rules/Calculators/A&P                           Final Clinical Impression(s) / ED Diagnoses Final diagnoses:  Episodic lightheadedness  RLQ abdominal pain    Rx / DC Orders ED Discharge Orders    None       Bethel Born, PA-C 02/24/20 1936    Tegeler, Canary Brim, MD 02/24/20 [phone]

## 2020-02-24 NOTE — ED Notes (Signed)
Pt verbalizes understanding of DC instructions. Pt belongings returned and is ambulatory out of ED.  

## 2020-02-24 NOTE — ED Notes (Signed)
Kelly PA at bedside   

## 2020-04-20 ENCOUNTER — Other Ambulatory Visit: Payer: Self-pay

## 2020-04-20 ENCOUNTER — Ambulatory Visit
Admission: EM | Admit: 2020-04-20 | Discharge: 2020-04-20 | Disposition: A | Discharge status: Home / Self Care | Payer: Medicaid Other | Attending: Emergency Medicine | Admitting: Emergency Medicine

## 2020-04-20 DIAGNOSIS — Z1152 Encounter for screening for COVID-19: Secondary | ICD-10-CM

## 2020-04-20 DIAGNOSIS — J069 Acute upper respiratory infection, unspecified: Secondary | ICD-10-CM | POA: Diagnosis not present

## 2020-04-20 MED ORDER — CETIRIZINE HCL 10 MG PO TABS: 10.0000 mg | ORAL_TABLET | Freq: Every day | ORAL | 0 refills | Status: DC

## 2020-04-20 MED ORDER — FLUTICASONE PROPIONATE 50 MCG/ACT NA SUSP: 1.0000 | Freq: Every day | NASAL | 0 refills | Status: DC

## 2020-04-20 MED ORDER — BENZONATATE 100 MG PO CAPS: 100.0000 mg | ORAL_CAPSULE | Freq: Three times a day (TID) | ORAL | 0 refills | Status: DC

## 2020-04-20 NOTE — Discharge Instructions (Addendum)

## 2020-04-20 NOTE — ED Provider Notes (Signed)
[facility]    CSN: [id number] Arrival date & time: 04/20/20  0840      History   Chief Complaint Chief Complaint  Patient presents with  . Nasal Congestion    HPI Autumn Mclaughlin is a 39 y.o. female  Presenting for Covid testing.  Patient provides history: Endorsing nasal congestion, sore throat, sinus pressure and fatigue since yesterday.  Does have a mild cough.  States her daughter was just diagnosed with "pneumonia and bronchitis ".  Patient currently does smoke: Has not cut back yet.  Denies chest pain, palpitations, difficulty breathing, fever, nausea, vomiting, diarrhea.  Past Medical History:  Diagnosis Date  . H/O cone biopsy of cervix   . Hypothyroidism   . Preterm labor     Patient Active Problem List   Diagnosis Date Noted  . Status post primary low transverse cesarean section 02/05/2015  . Preterm premature rupture of membranes (PPROM) with unknown onset of labor 01/26/2015    Past Surgical History:  Procedure Laterality Date  . CESAREAN SECTION N/A 02/02/2015   Procedure: CESAREAN SECTION;  Surgeon: Myna Hidalgo, DO;  Location: [facility];  Service: Obstetrics;  Laterality: N/A;  . EYE SURGERY    . GASTRIC BYPASS      OB History    Gravida  4   Para  2   Term      Preterm  2   AB  2   Living  2     SAB  2   TAB      Ectopic      Multiple  0   Live Births  2            Home Medications    Prior to Admission medications   Medication Sig Start Date End Date Taking? Authorizing Provider  benzonatate (TESSALON) 100 MG capsule Take 1 capsule (100 mg total) by mouth every 8 (eight) hours. 04/20/20   Hall-Potvin, [place], PA-C  cetirizine (ZYRTEC ALLERGY) 10 MG tablet Take 1 tablet (10 mg total) by mouth daily. 04/20/20   Hall-Potvin, [place], PA-C  fluticasone (FLONASE) 50 MCG/ACT nasal spray Place 1 spray into both nostrils daily. 04/20/20   Hall-Potvin, [place], PA-C  levothyroxine (SYNTHROID) 112 MCG tablet Take 112 mcg  by mouth daily. 08/19/19   [provider]  Multiple Vitamins-Minerals (MULTIVITAMIN ADULTS PO) Take 1 tablet by mouth daily.    [provider]  vitamin B-12 (CYANOCOBALAMIN) 100 MCG tablet Take 100 mcg by mouth daily.    [provider]    Family History Family History  Problem Relation Age of Onset  . Diabetes Maternal Grandfather   . Asthma Sister   . Cancer Sister   . Cancer Maternal Grandmother   . Cancer Paternal Grandmother   . Thyroid disease Mother   . Cancer Father     Social History Social History   Tobacco Use  . Smoking status: Current Every Day Smoker    Packs/day: 1.00    Types: Cigarettes    Last attempt to quit: 08/19/2014    Years since quitting: 5.6  . Smokeless tobacco: Never Used  Vaping Use  . Vaping Use: Never used  Substance Use Topics  . Alcohol use: No  . Drug use: No     Allergies   Nsaids and Sulfa antibiotics   Review of Systems As per HPI   Physical Exam Triage Vital Signs ED Triage Vitals  Enc Vitals Group     BP  Pulse      Resp      Temp      Temp src      SpO2      Weight      Height      Head Circumference      Peak Flow      Pain Score      Pain Loc      Pain Edu?      Excl. in GC?    No data found.  Updated Vital Signs BP 105/71 (BP Location: Left Arm)   Pulse 70   Temp 98.7 F (37.1 C) (Oral)   Resp 16   LMP 03/15/2020   SpO2 98%   Breastfeeding No   Visual Acuity Right Eye Distance:   Left Eye Distance:   Bilateral Distance:    Right Eye Near:   Left Eye Near:    Bilateral Near:     Physical Exam Constitutional:      General: She is not in acute distress.    Appearance: She is obese. She is not ill-appearing or diaphoretic.  HENT:     Head: Normocephalic and atraumatic.     Mouth/Throat:     Mouth: Mucous membranes are moist.     Pharynx: Oropharynx is clear. No oropharyngeal exudate or posterior oropharyngeal erythema.  Eyes:     General: No scleral  icterus.    Conjunctiva/sclera: Conjunctivae normal.     Pupils: Pupils are equal, round, and reactive to light.  Neck:     Comments: Trachea midline, negative JVD Cardiovascular:     Rate and Rhythm: Normal rate and regular rhythm.     Heart sounds: No murmur heard.  No gallop.   Pulmonary:     Effort: Pulmonary effort is normal. No respiratory distress.     Breath sounds: No wheezing, rhonchi or rales.  Musculoskeletal:     Cervical back: Neck supple. No tenderness.  Lymphadenopathy:     Cervical: No cervical adenopathy.  Skin:    Capillary Refill: Capillary refill takes less than 2 seconds.     Coloration: Skin is not jaundiced or pale.     Findings: No rash.  Neurological:     General: No focal deficit present.     Mental Status: She is alert and oriented to person, place, and time.      UC Treatments / Results  Labs (all labs ordered are listed, but only abnormal results are displayed) Labs Reviewed  NOVEL CORONAVIRUS, NAA    EKG   Radiology No results found.  Procedures Procedures (including critical care time)  Medications Ordered in UC Medications - No data to display  Initial Impression / Assessment and Plan / UC Course  I have reviewed the triage vital signs and the nursing notes.  Pertinent labs & imaging results that were available during my care of the patient were reviewed by me and considered in my medical decision making (see chart for details).     Patient afebrile, nontoxic, with SpO2 98%.  Covid PCR pending.  Patient to quarantine until results are back.  We will treat supportively as outlined below.  Return precautions discussed, patient verbalized understanding and is agreeable to plan. Final Clinical Impressions(s) / UC Diagnoses   Final diagnoses:  Encounter for screening for COVID-19  Viral URI     Discharge Instructions     Tessalon for cough. Start flonase, atrovent nasal spray for nasal congestion/drainage. You can use over  the counter nasal saline  rinse such as neti pot for nasal congestion. Keep hydrated, your urine should be clear to pale yellow in color. Tylenol/motrin for fever and pain. Monitor for any worsening of symptoms, chest pain, shortness of breath, wheezing, swelling of the throat, go to the emergency department for further evaluation needed.     ED Prescriptions    Medication Sig Dispense Auth. Provider   cetirizine (ZYRTEC ALLERGY) 10 MG tablet Take 1 tablet (10 mg total) by mouth daily. 30 tablet Hall-Potvin, [place], PA-C   fluticasone (FLONASE) 50 MCG/ACT nasal spray Place 1 spray into both nostrils daily. 16 g Hall-Potvin, [place], PA-C   benzonatate (TESSALON) 100 MG capsule Take 1 capsule (100 mg total) by mouth every 8 (eight) hours. 21 capsule Hall-Potvin, [place], PA-C     PDMP not reviewed this encounter.   Hall-Potvin, [place], [place] 04/20/20 1056

## 2020-04-20 NOTE — ED Triage Notes (Signed)
Pt c/o nasal congestion, burning in throat, sinus pressure, and fatigue since yesterday. States her daughter just dx'd with PNA/bronchitis.

## 2020-04-22 LAB — NOVEL CORONAVIRUS, NAA: SARS-CoV-2, NAA: NOT DETECTED

## 2020-08-16 ENCOUNTER — Emergency Department (HOSPITAL_COMMUNITY)
Admission: EM | Admit: 2020-08-16 | Discharge: 2020-08-16 | Disposition: A | Discharge status: Home / Self Care | Payer: Medicaid Other | Attending: Emergency Medicine | Admitting: Emergency Medicine

## 2020-08-16 ENCOUNTER — Other Ambulatory Visit: Payer: Self-pay

## 2020-08-16 ENCOUNTER — Encounter (HOSPITAL_COMMUNITY): Payer: Self-pay | Admitting: Emergency Medicine

## 2020-08-16 DIAGNOSIS — E039 Hypothyroidism, unspecified: Secondary | ICD-10-CM | POA: Insufficient documentation

## 2020-08-16 DIAGNOSIS — F17210 Nicotine dependence, cigarettes, uncomplicated: Secondary | ICD-10-CM | POA: Insufficient documentation

## 2020-08-16 DIAGNOSIS — M791 Myalgia, unspecified site: Secondary | ICD-10-CM | POA: Diagnosis not present

## 2020-08-16 DIAGNOSIS — Z79899 Other long term (current) drug therapy: Secondary | ICD-10-CM | POA: Insufficient documentation

## 2020-08-16 DIAGNOSIS — M7918 Myalgia, other site: Secondary | ICD-10-CM

## 2020-08-16 DIAGNOSIS — M545 Low back pain, unspecified: Secondary | ICD-10-CM | POA: Diagnosis present

## 2020-08-16 LAB — URINALYSIS, ROUTINE W REFLEX MICROSCOPIC
Bilirubin Urine: NEGATIVE
Glucose, UA: NEGATIVE mg/dL
Hgb urine dipstick: NEGATIVE
Ketones, ur: NEGATIVE mg/dL
Leukocytes,Ua: NEGATIVE
Nitrite: NEGATIVE
Protein, ur: NEGATIVE mg/dL
Specific Gravity, Urine: 1.025 (ref 1.005–1.030)
pH: 5.5 (ref 5.0–8.0)

## 2020-08-16 LAB — PREGNANCY, URINE: Preg Test, Ur: NEGATIVE

## 2020-08-16 MED ORDER — HYDROCODONE-ACETAMINOPHEN 5-325 MG PO TABS
1.0000 | ORAL_TABLET | Freq: Once | ORAL | Status: AC
  Administered 2020-08-16: 14:00:00 1 via ORAL
  Filled 2020-08-16: qty 1

## 2020-08-16 MED ORDER — METHOCARBAMOL 500 MG PO TABS: 500.0000 mg | ORAL_TABLET | Freq: Two times a day (BID) | ORAL | 0 refills | Status: DC

## 2020-08-16 NOTE — ED Triage Notes (Addendum)
Pt arrives to ED with c/o lower back pain x2 days. Unrelieved by OTC medications. Had normal xray on 12/27, rx flexeril. No bowel or bladder issues. Pain is constant, does not radiate.

## 2020-08-16 NOTE — ED Notes (Signed)
Pt ambulated well to the bathroom  

## 2020-08-16 NOTE — Discharge Instructions (Signed)
Please pick up prescription and take as prescribed. Discontinue Flexeril and use this muscle relaxer instead. DO NOT DRIVE WHILE ON THE MUSCLE RELAXER AS IT CAN MAKE YOU DROWSY.   I would also recommend SalonPas patches. They can be bought over the counter. Apply as directed per package instructions.   Please follow up with your PCP next week for recheck of your symptoms.   Return to the ED for any worsening symptoms including worsening pain, numbness in your groin, holding onto urine, peeing or pooping on yourself, inability to ambulate, or any other new/concerning symptoms.

## 2020-08-16 NOTE — ED Provider Notes (Signed)
[facility] EMERGENCY DEPARTMENT Provider Note   CSN: [id number] Arrival date & time: 08/16/20  2202     History Chief Complaint  Patient presents with  . Back Pain    Autumn Mclaughlin is a 39 y.o. female who presents to the ED today with complaint of gradual onset, constant, achy, diffuse lower back pain (R > L side) x 5 days.  Patient denies any trauma or heavy lifting.  States she woke up with the pain and did not think much of it however began to progressively get worse.  She went to her PCP 2 days ago and had an x-ray done without any acute findings.  States that she was told she was having muscle spasms and was discharged with Flexeril.  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  She reports she has not had any relief with the Flexeril and when she woke up today she could hardly move prompting her to come to the ED.  She denies any fevers, chills, saddle anesthesia, urinary retention, urinary or bowel incontinence, dysuria, urinary frequency, hematuria, abdominal pain, pelvic pain, vaginal discharge, any other associated symptoms.  Last normal menstrual period was 12/18.  History of IV drug use.  No prolonged steroid therapy.  No recent spinal manipulation.  No history of back surgery.   The history is provided by the patient and medical records.       Past Medical History:  Diagnosis Date  . H/O cone biopsy of cervix   . Hypothyroidism   . Preterm labor     Patient Active Problem List   Diagnosis Date Noted  . Status post primary low transverse cesarean section 02/05/2015  . Preterm premature rupture of membranes (PPROM) with unknown onset of labor 01/26/2015    Past Surgical History:  Procedure Laterality Date  . CESAREAN SECTION N/A 02/02/2015   Procedure: CESAREAN SECTION;  Surgeon: Myna Hidalgo, DO;  Location: [facility];  Service: Obstetrics;   Laterality: N/A;  . EYE SURGERY    . GASTRIC BYPASS       OB History    Gravida  4   Para  2   Term      Preterm  2   AB  2   Living  2     SAB  2   IAB      Ectopic      Multiple  0   Live Births  2           Family History  Problem Relation Age of Onset  . Diabetes Maternal Grandfather   . Asthma Sister   . Cancer Sister   . Cancer Maternal Grandmother   . Cancer Paternal Grandmother   . Thyroid disease Mother   . Cancer Father     Social History   Tobacco Use  . Smoking status: Current Every Day Smoker    Packs/day: 1.00    Types: Cigarettes    Last attempt to quit: 08/19/2014    Years since quitting: 5.9  . Smokeless tobacco: Never Used  Vaping Use  . Vaping Use: Never used  Substance Use Topics  . Alcohol use: No  . Drug use: No    Home Medications Prior to Admission medications   Medication Sig Start Date End Date Taking? Authorizing Provider  methocarbamol (ROBAXIN) 500 MG tablet Take 1 tablet (500 mg total) by mouth 2 (two)  times daily. 08/16/20  Yes Wilmarie Sparlin, PA-C  benzonatate (TESSALON) 100 MG capsule Take 1 capsule (100 mg total) by mouth every 8 (eight) hours. 04/20/20   Hall-Potvin, [place], PA-C  cetirizine (ZYRTEC ALLERGY) 10 MG tablet Take 1 tablet (10 mg total) by mouth daily. 04/20/20   Hall-Potvin, [place], PA-C  fluticasone (FLONASE) 50 MCG/ACT nasal spray Place 1 spray into both nostrils daily. 04/20/20   Hall-Potvin, [place], PA-C  levothyroxine (SYNTHROID) 112 MCG tablet Take 112 mcg by mouth daily. 08/19/19   [provider]  Multiple Vitamins-Minerals (MULTIVITAMIN ADULTS PO) Take 1 tablet by mouth daily.    [provider]  vitamin B-12 (CYANOCOBALAMIN) 100 MCG tablet Take 100 mcg by mouth daily.    [provider]    Allergies    Nsaids and Sulfa antibiotics  Review of Systems   Review of Systems  Constitutional: Negative for chills and fever.  Genitourinary: Negative for difficulty  urinating, dysuria, flank pain and frequency.  Musculoskeletal: Positive for back pain.    Physical Exam Updated Vital Signs BP 119/66 (BP Location: Right Arm)   Pulse 65   Temp 98.2 F (36.8 C) (Oral)   Resp 16   Ht 5\' 4"  (1.626 m)   Wt 84.8 kg   SpO2 99%   BMI 32.10 kg/m   Physical Exam Vitals and nursing note reviewed.  Constitutional:      Appearance: She is not ill-appearing.  HENT:     Head: Normocephalic and atraumatic.  Eyes:     Conjunctiva/sclera: Conjunctivae normal.  Cardiovascular:     Rate and Rhythm: Normal rate and regular rhythm.     Pulses: Normal pulses.  Pulmonary:     Effort: Pulmonary effort is normal.     Breath sounds: Normal breath sounds. No wheezing, rhonchi or rales.  Abdominal:     Tenderness: There is no abdominal tenderness. There is right CVA tenderness. There is no guarding or rebound.  Musculoskeletal:     Comments: No C, T, or L midline spinal TTP. + diffuse lower musculature TTP however does appear to have some right CVA TTP. ROM intact to neck and back. Strength 5/5 to BLEs. Sensation intact throughout BLEs. Negative SLR bilaterally.   Skin:    General: Skin is warm and dry.     Coloration: Skin is not jaundiced.  Neurological:     Mental Status: She is alert.     ED Results / Procedures / Treatments   Labs (all labs ordered are listed, but only abnormal results are displayed) Labs Reviewed  URINALYSIS, ROUTINE W REFLEX MICROSCOPIC  PREGNANCY, URINE    EKG None  Radiology No results found.  Procedures Procedures (including critical care time)  Medications Ordered in ED Medications  HYDROcodone-acetaminophen (NORCO/VICODIN) 5-325 MG per tablet 1 tablet (has no administration in time range)    ED Course  I have reviewed the triage vital signs and the nursing notes.  Pertinent labs & imaging results that were available during my care of the patient were reviewed by me and considered in my medical decision making  (see chart for details).    MDM Rules/Calculators/A&P                          39 year old female who presents to the ED today complaining of atraumatic lower back pain, right greater than left side for the past 5 days.  Seen at PCP several days ago and had a negative x-ray treated  with Flexeril unsuccessfully.  Presents to the ED today with worsening pain.  On arrival to the ED vitals are stable.  Patient appears to be in no acute distress.  On exam she does have diffuse lower musculature tenderness palpation however she does also have some right CVA tenderness.  She denies any urinary symptoms.  She is neurovascularly intact throughout, no red flag symptoms today concerning for cauda equina, spinal epidural abscess, AAA.  We will plan to check a urine and urine pregnancy at this time.  If negative will treat for musculoskeletal type pain and switch medication around.  Patient unable to take NSAIDs secondary to gastric bypass surgery last year.  She does not believe she has ever had prednisone however given her history will refrain from prednisone as well.   U/A negative and urine preg negative. Pain meds provided in the ED. Will plan to discharge patient home with different muscle relaxer and lidocaine patches with PCP follow up. Pt is in agreement with plan and stable for discharge home.   This note was prepared using Dragon voice recognition software and may include unintentional dictation errors due to the inherent limitations of voice recognition software.  Final Clinical Impression(s) / ED Diagnoses Final diagnoses:  Musculoskeletal pain    Rx / DC Orders ED Discharge Orders         Ordered    methocarbamol (ROBAXIN) 500 MG tablet  2 times daily        08/16/20 1323           Discharge Instructions     Please pick up prescription and take as prescribed. Discontinue Flexeril and use this muscle relaxer instead. DO NOT DRIVE WHILE ON THE MUSCLE RELAXER AS IT CAN MAKE YOU DROWSY.    I would also recommend SalonPas patches. They can be bought over the counter. Apply as directed per package instructions.   Please follow up with your PCP next week for recheck of your symptoms.   Return to the ED for any worsening symptoms including worsening pain, numbness in your groin, holding onto urine, peeing or pooping on yourself, inability to ambulate, or any other new/concerning symptoms.        Tanda Rockers, PA-C 08/16/20 1325    Alvira Monday, MD 08/16/20 [phone]

## 2020-12-22 ENCOUNTER — Ambulatory Visit: Payer: Medicaid Other | Attending: Nurse Practitioner | Admitting: Physical Therapy

## 2020-12-22 ENCOUNTER — Other Ambulatory Visit: Payer: Self-pay

## 2020-12-22 DIAGNOSIS — R293 Abnormal posture: Secondary | ICD-10-CM | POA: Insufficient documentation

## 2020-12-22 DIAGNOSIS — M545 Low back pain, unspecified: Secondary | ICD-10-CM | POA: Diagnosis present

## 2020-12-22 NOTE — Therapy (Signed)
[facility] Outpatient Rehabilitation [facility] [address] [place], [place], [address] Phone: [phone]   Fax:  [phone]  Physical Therapy Evaluation  Patient Details  Name: Autumn Mclaughlin MRN: [id number] Date of Birth: [birth year] Referring Provider (PT): Carmel Sacramento, NP   Encounter Date: 12/22/2020   PT End of Session - 12/22/20 0849    Visit Number 1    Number of Visits 5    Date for PT Re-Evaluation 01/19/21    Authorization Type Medicaid -- needs 3 visits for auth    Authorization - Number of Visits 3    PT Start Time 0850    PT Stop Time 0930    PT Time Calculation (min) 40 min    Activity Tolerance Patient tolerated treatment well    Behavior During Therapy [facility] for tasks assessed/performed           Past Medical History:  Diagnosis Date  . H/O cone biopsy of cervix   . Hypothyroidism   . Preterm labor     Past Surgical History:  Procedure Laterality Date  . CESAREAN SECTION N/A 02/02/2015   Procedure: CESAREAN SECTION;  Surgeon: Myna Hidalgo, DO;  Location: [facility];  Service: Obstetrics;  Laterality: N/A;  . EYE SURGERY    . GASTRIC BYPASS      There were no vitals filed for this visit.    Subjective Assessment - 12/22/20 0854    Subjective Pt with back pain for several months. Pt got an x-ray and it looked like she pulled a muscle; however, it's been 4 months now. Pt did a CT scan and back appeared normal. Pt states the pain is mostly on the right side. Feels it is a burning sensation. Pt states she can wake up in pain. Pt has tried heat which seems to help. No change in pain since 4 months. Pt had gastric bypass surgery in Nov of 2020. Feels it the most when she's sitting or laying. Walking helps a little bit    Limitations Sitting;Standing    How long can you sit comfortably? ~15 minutes    How long can you stand comfortably? ~30 minutes to an hour depending on what she's doing    Patient Stated Goals Decrease pain so she can play  with her daughter    Currently in Pain? Yes    Pain Score 7     Pain Location Back    Pain Orientation Right;Mid    Pain Descriptors / Indicators Burning;Aching    Pain Type Chronic pain    Pain Radiating Towards hip    Pain Onset More than a month ago    Pain Frequency Constant    Aggravating Factors  sitting, prolonged standing, laying down    Pain Relieving Factors heat, walking              [facility] PT Assessment - 12/22/20 0001      Assessment   Medical Diagnosis M54.50 (ICD-10-CM) - Low back pain, unspecified    Referring Provider (PT) Carmel Sacramento, NP    Onset Date/Surgical Date --   4 months   Hand Dominance Right    Prior Therapy None      Precautions   Precautions None      Restrictions   Weight Bearing Restrictions No      Balance Screen   Has the patient fallen in the past 6 months No      Home Environment   Living Environment Private residence    Living  Arrangements Children;Spouse/significant other      Prior Function   Vocation Full time 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; 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      Observation/Other Assessments   Focus on Therapeutic Outcomes (FOTO)  n/a      Functional Tests   Functional tests Squat      Squat   Comments Felt more pain after performing squat      Posture/Postural Control   Posture Comments Mild lumbar lordosis in standing -- more seen in prone; R iliac crest slightly higher than L      ROM / Strength   AROM / PROM / Strength AROM;Strength      AROM   AROM Assessment Site Lumbar    Lumbar Flexion WFL    Lumbar Extension 90%    Lumbar - Right Side Bend WFL    Lumbar - Left Side Bend 80%    Lumbar - Right Rotation WFL    Lumbar - Left Rotation WFL   Feels on R     Strength   Overall Strength Comments overall strength grossly 5/5      Palpation   Palpation comment TTP and shortened R QL, psoas, and paraspinals      Special Tests    Special Tests  Lumbar    Lumbar Tests FABER test;Slump Test;Prone Knee Bend Test;Straight Leg Raise      FABER test   findings Negative    Comment feels in mid back bilat      Slump test   Findings Negative      Prone Knee Bend Test   Findings Negative      Straight Leg Raise   Findings Positive    Side  Left    Comment Felt in mid back                      Objective measurements completed on examination: See above findings.               PT Education - 12/22/20 1230    Education Details Exam findings, POC, HEP. Discussed TPDN.    Person(s) Educated Patient    Methods Explanation;Demonstration;Handout    Comprehension Verbalized understanding;Returned demonstration;Verbal cues required;Tactile cues required            PT Short Term Goals - 12/22/20 1223      PT SHORT TERM GOAL #1   Title STG = LTG             PT Long Term Goals - 12/22/20 1223      PT LONG TERM GOAL #1   Title Pt will be independent with HEP    Time 4    Period Weeks    Status New    Target Date 01/19/21      PT LONG TERM GOAL #2   Title Pt will report reduction in pain by at least 75%    Baseline 7/10 on initial eval    Time 4    Period Weeks    Status New    Target Date 01/19/21      PT LONG TERM GOAL #3   Title Pt will be able to tolerate sitting for at least 30 minutes    Baseline ~15 minutes before discomfort    Time 4    Period Weeks    Status New    Target Date 01/19/21      PT LONG  TERM GOAL #4   Title Pt will have full lumbar ROM with no discomfort    Time 4    Period Weeks    Status New    Target Date 01/19/21                  Plan - 12/22/20 1207    Clinical Impression Statement Ms. Bauman is a 40 y/o F presenting to OPPT due to c/o ongoing low back pain (R>L) x 4 months with no MOI. On assessment, pt demos decreased lumbar ROM due to shortened/tight psoas, QL, and paraspinals. Pt with otherwise good spinal mobility and general strength. Mild  anterior pelvic tilt and lumbar lordosis. Increased time spent providing stretches in various positions and discussed sleeping positions to reduce her lumbar lordosis.    Personal Factors and Comorbidities Age;Time since onset of injury/illness/exacerbation    Examination-Activity Limitations Sleep;Caring for Others;Sit;Stand    Examination-Participation Restrictions Community Activity;Occupation;Yard Work;Cleaning;Interpersonal Relationship    Stability/Clinical Decision Making Stable/Uncomplicated    Clinical Decision Making Low    Rehab Potential Good    PT Frequency 1x / week    PT Duration 4 weeks    PT Treatment/Interventions ADLs/Self Care Home Management;Cryotherapy;Electrical Stimulation;Moist Heat;Ultrasound;DME Instruction;Gait training;Stair training;Functional mobility training;Therapeutic activities;Therapeutic exercise;Balance training;Neuromuscular re-education;Patient/family education;Manual techniques;Passive range of motion;Dry needling;Taping;Vasopneumatic Device;Spinal Manipulations    PT Next Visit Plan Assess response to HEP and review as needed. How was change in sleeping position? TPDN/manual therapy for back pain. Continue stretches. Initiate core strengthening. Review seated and standing posture.    PT Home Exercise Plan Access Code [email or web address]    Consulted and Agree with Plan of Care Patient           Patient will benefit from skilled therapeutic intervention in order to improve the following deficits and impairments:  Decreased mobility,Increased muscle spasms,Decreased range of motion,Improper body mechanics,Decreased activity tolerance,Impaired flexibility,Postural dysfunction,Pain,Increased fascial restricitons  Visit Diagnosis: Right-sided low back pain without sciatica, unspecified chronicity  Abnormal posture     Problem List Patient Active Problem List   Diagnosis Date Noted  . Status post primary low transverse cesarean section 02/05/2015  . Preterm  premature rupture of membranes (PPROM) with unknown onset of labor 01/26/2015    [facility] April Dell Ponto PT, DPT 12/22/2020, 12:31 PM  [facility] [address] [place], [place], [address] Phone: [phone]   Fax:  [phone]  Name: Gradie Butrick MRN: [id number] Date of Birth: [birth year]

## 2020-12-22 NOTE — Patient Instructions (Signed)
Access Code: [email or web address] URL: [email or web address] Date: 12/22/2020 Prepared by: Vernon Prey [name]  Exercises Supine Double Knee to Chest Advanced - 1 x daily - 7 x weekly - 3 sets - 20-30 sec hold Seated Quadratus Lumborum Stretch with Forward Bend - 1 x daily - 7 x weekly - 3 sets - 20-30 sec hold Seated Flexion Stretch - 1 x daily - 7 x weekly - 3 sets - 20-30 sec hold Supine Lower Trunk Rotation - 1 x daily - 7 x weekly - 3 sets - 20-30 sec hold Standing 'L' Stretch at [employer] - 1 x daily - 7 x weekly - 3 sets - 20-30 sec hold Half Kneeling Hip Flexor Stretch - 1 x daily - 7 x weekly - 3 sets - 20-30 sec hold Modified Thomas Stretch - 1 x daily - 7 x weekly - 3 sets - 20-30 sec hold

## 2021-01-02 ENCOUNTER — Other Ambulatory Visit: Payer: Self-pay

## 2021-01-02 ENCOUNTER — Ambulatory Visit: Payer: Medicaid Other | Admitting: Physical Therapy

## 2021-01-02 DIAGNOSIS — M545 Low back pain, unspecified: Secondary | ICD-10-CM

## 2021-01-02 DIAGNOSIS — R293 Abnormal posture: Secondary | ICD-10-CM

## 2021-01-02 NOTE — Therapy (Signed)
[facility] Outpatient Rehabilitation [facility] [address] [place], [place], [address] Phone: [phone]   Fax:  [phone]  Physical Therapy Treatment  Patient Details  Name: Autumn Mclaughlin MRN: [id number] Date of Birth: Oct 01, 1980 Referring Provider (PT): Carmel Sacramento, NP   Encounter Date: 01/02/2021   PT End of Session - 01/02/21 0938    Visit Number 2    Number of Visits 5    Date for PT Re-Evaluation 01/19/21    Authorization Type Medicaid -- needs 3 visits for auth    Authorization - Visit Number 1    Authorization - Number of Visits 3    PT Start Time 0845    PT Stop Time 0935    PT Time Calculation (min) 50 min    Activity Tolerance Patient tolerated treatment well    Behavior During Therapy [facility] for tasks assessed/performed           Past Medical History:  Diagnosis Date  . H/O cone biopsy of cervix   . Hypothyroidism   . Preterm labor     Past Surgical History:  Procedure Laterality Date  . CESAREAN SECTION N/A 02/02/2015   Procedure: CESAREAN SECTION;  Surgeon: Myna Hidalgo, DO;  Location: [facility];  Service: Obstetrics;  Laterality: N/A;  . EYE SURGERY    . GASTRIC BYPASS      There were no vitals filed for this visit.   Subjective Assessment - 01/02/21 0854    Subjective Pt reports she's been doing the stretches but she still feels that pain is there. Pt states that it has improved some. Continues to feel it when she's sitting for too long. She felt it especially after she cleaned her daughter's room.    Limitations Sitting;Standing    How long can you sit comfortably? ~15 minutes    How long can you stand comfortably? ~30 minutes to an hour depending on what she's doing    Patient Stated Goals Decrease pain so she can play with her daughter    Currently in Pain? Yes    Pain Score 5     Pain Location Back    Pain Orientation Right;Mid    Pain Descriptors / Indicators Aching;Burning    Pain Type Chronic pain    Pain Onset  More than a month ago                             [facility] Adult PT Treatment/Exercise - 01/02/21 0001      Exercises   Exercises Lumbar      Lumbar Exercises: Stretches   Double Knee to Chest Stretch 30 seconds    Lower Trunk Rotation 30 seconds;2 reps    Other Lumbar Stretch Exercise Child's pose x20 sec each forward & each side      Lumbar Exercises: Supine   Pelvic Tilt 10 seconds    Other Supine Lumbar Exercises marching with PPT x10      Lumbar Exercises: Prone   Other Prone Lumbar Exercises Plank knee & elbow x20 sec      Modalities   Modalities Moist Heat      Moist Heat Therapy   Number Minutes Moist Heat 10 Minutes    Moist Heat Location Lumbar Spine      Manual Therapy   Manual Therapy Myofascial release;Soft tissue mobilization    Manual therapy comments QL and paraspinal palpation    Soft tissue mobilization QL TPR and STW  Myofascial Release QL            Trigger Point Dry Needling - 01/02/21 0001    Consent Given? Yes    Education Handout Provided Yes    Muscles Treated Back/Hip Quadratus lumborum    Other Dry Needling Performed by Garen Lah    Quadratus Lumborum Response Twitch response elicited;Palpable increased muscle length                PT Education - 01/02/21 0936    Education Details Provided handout for TPDN, updated HEP, discussed Yoga    Person(s) Educated Patient    Methods Explanation;Demonstration;Handout    Comprehension Verbalized understanding;Returned demonstration;Verbal cues required;Tactile cues required            PT Short Term Goals - 12/22/20 1223      PT SHORT TERM GOAL #1   Title STG = LTG             PT Long Term Goals - 12/22/20 1223      PT LONG TERM GOAL #1   Title Pt will be independent with HEP    Time 4    Period Weeks    Status New    Target Date 01/19/21      PT LONG TERM GOAL #2   Title Pt will report reduction in pain by at least 75%    Baseline 7/10 on  initial eval    Time 4    Period Weeks    Status New    Target Date 01/19/21      PT LONG TERM GOAL #3   Title Pt will be able to tolerate sitting for at least 30 minutes    Baseline ~15 minutes before discomfort    Time 4    Period Weeks    Status New    Target Date 01/19/21      PT LONG TERM GOAL #4   Title Pt will have full lumbar ROM with no discomfort    Time 4    Period Weeks    Status New    Target Date 01/19/21                 Plan - 01/02/21 0936    Clinical Impression Statement Treatment session focused on addressing pt's R mid/low back pain -- mostly in QL and paraspinals. TPDN provided by Garen Lah and additional manual therapy and stretches provided. Initiated core strengthening. Pt with good tolerance. Pt interested in yoga.    Personal Factors and Comorbidities Age;Time since onset of injury/illness/exacerbation    Examination-Activity Limitations Sleep;Caring for Others;Sit;Stand    Examination-Participation Restrictions Community Activity;Occupation;Yard Work;Cleaning;Interpersonal Relationship    Stability/Clinical Decision Making Stable/Uncomplicated    Rehab Potential Good    PT Frequency 1x / week    PT Duration 4 weeks    PT Treatment/Interventions ADLs/Self Care Home Management;Cryotherapy;Electrical Stimulation;Moist Heat;Ultrasound;DME Instruction;Gait training;Stair training;Functional mobility training;Therapeutic activities;Therapeutic exercise;Balance training;Neuromuscular re-education;Patient/family education;Manual techniques;Passive range of motion;Dry needling;Taping;Vasopneumatic Device;Spinal Manipulations    PT Next Visit Plan Assess response to HEP and review as needed. TPDN/manual therapy for back pain. Continue stretches. Initiate core strengthening. Review seated and standing posture.    PT Home Exercise Plan Access Code [email or web address]    Consulted and Agree with Plan of Care Patient           Patient will benefit from skilled  therapeutic intervention in order to improve the following deficits and impairments:  Decreased mobility,Increased muscle spasms,Decreased range of motion,Improper body mechanics,Decreased  activity tolerance,Impaired flexibility,Postural dysfunction,Pain,Increased fascial restricitons  Visit Diagnosis: Right-sided low back pain without sciatica, unspecified chronicity  Abnormal posture     Problem List Patient Active Problem List   Diagnosis Date Noted  . Status post primary low transverse cesarean section 02/05/2015  . Preterm premature rupture of membranes (PPROM) with unknown onset of labor 01/26/2015    [facility] April Dell Ponto PT, DPT 01/02/2021, 9:44 AM  [facility] [address] [place], [place], [address] Phone: [phone]   Fax:  [phone]  Name: Autumn Mclaughlin MRN: [id number] Date of Birth: Dec 07, 1980

## 2021-01-11 ENCOUNTER — Other Ambulatory Visit: Payer: Self-pay

## 2021-01-11 ENCOUNTER — Encounter: Payer: Self-pay | Admitting: Physical Therapy

## 2021-01-11 ENCOUNTER — Ambulatory Visit: Payer: Medicaid Other | Admitting: Physical Therapy

## 2021-01-11 DIAGNOSIS — M545 Low back pain, unspecified: Secondary | ICD-10-CM

## 2021-01-11 DIAGNOSIS — R293 Abnormal posture: Secondary | ICD-10-CM

## 2021-01-11 NOTE — Therapy (Signed)
[place], [place], [address] Phone: [phone]   Fax:  [phone]  Physical Therapy Treatment  Patient Details  Name: Autumn Mclaughlin MRN: [id number] Date of Birth: 02-21-1981 Referring Provider (PT): Emelia Loron, NP   Encounter Date: 01/11/2021   PT End of Session - 01/11/21 0848    Visit Number 3    Number of Visits 5    Date for PT Re-Evaluation 01/19/21    Authorization Type U[employer] MCD    Authorization - Visit Number 2    Authorization - Number of Visits 27    PT Start Time 0848    PT Stop Time 0937    PT Time Calculation (min) 49 min    Activity Tolerance Patient tolerated treatment well    Behavior During Therapy [facility] for tasks assessed/performed           Past Medical History:  Diagnosis Date  . H/O cone biopsy of cervix   . Hypothyroidism   . Preterm labor     Past Surgical History:  Procedure Laterality Date  . CESAREAN SECTION N/A 02/02/2015   Procedure: CESAREAN SECTION;  Surgeon: Janyth Pupa, DO;  Location: [facility];  Service: Obstetrics;  Laterality: N/A;  . EYE SURGERY    . GASTRIC BYPASS      There were no vitals filed for this visit.   Subjective Assessment - 01/11/21 0850    Subjective " things are going alright, it depends on the day and what I do. The stretches are helping, I've been trying to avoid taking the pain medication unless I need to."    Patient Stated Goals Decrease pain so she can play with her daughter    Currently in Pain? Yes    Pain Score 0-No pain   last took meds for pain at 7:15   Pain Location Back    Pain Orientation Right    Aggravating Factors  sitting, prolonged standing, laying down    Pain Relieving Factors heat, walking              [facility] PT Assessment - 01/11/21 0001      Assessment   Medical Diagnosis M54.50 (ICD-10-CM) - Low back pain, unspecified    Referring Provider (PT) Emelia Loron, NP                          [facility] Adult PT Treatment/Exercise - 01/11/21 0001      Lumbar Exercises: Stretches   Active Hamstring Stretch 4 reps;30 seconds   PNF contract/ relax   Passive Hamstring Stretch 2 reps;30 seconds;Right   seated     Lumbar Exercises: Supine   Straight Leg Raise 10 reps   x 2 sets     Modalities   Modalities Electrical Stimulation      Moist Heat Therapy   Number Minutes Moist Heat 10 Minutes    Moist Heat Location Lumbar Spine   seated     Electrical Stimulation   Electrical Stimulation Location r low back    Electrical Stimulation Action IFC    Electrical Stimulation Parameters 100% scan, L 165.5 x 10 min    Electrical Stimulation Goals Pain      Manual Therapy   Manual Therapy Muscle Energy Technique;Joint mobilization    Manual therapy comments skilled palpation and monitoring of pt throughout TPDN    Joint Mobilization RLE LAD grade V    Soft tissue mobilization MTPR along the R  QL    Muscle Energy Technique Resisted R hip flexion 1 x 10 holding 5 seconds                  PT Education - 01/11/21 0927    Education Details Reviewed HEp and updated HEP to include hamsring stretch and SLR    Person(s) Educated Patient    Methods Explanation;Verbal cues;Handout    Comprehension Verbalized understanding;Verbal cues required            PT Short Term Goals - 12/22/20 1223      PT SHORT TERM GOAL #1   Title STG = LTG             PT Long Term Goals - 12/22/20 1223      PT LONG TERM GOAL #1   Title Pt will be independent with HEP    Time 4    Period Weeks    Status New    Target Date 01/19/21      PT LONG TERM GOAL #2   Title Pt will report reduction in pain by at least 75%    Baseline 7/10 on initial eval    Time 4    Period Weeks    Status New    Target Date 01/19/21      PT LONG TERM GOAL #3   Title Pt will be able to tolerate sitting for at least 30 minutes    Baseline ~15 minutes before discomfort    Time 4     Period Weeks    Status New    Target Date 01/19/21      PT LONG TERM GOAL #4   Title Pt will have full lumbar ROM with no discomfort    Time 4    Period Weeks    Status New    Target Date 01/19/21                 Plan - 01/11/21 2458    Clinical Impression Statement pt notes she is doing better but does have to continue taking he rpain medication for complete pain relief.  Further assessed revealed potential SIJ invovlement on the R and responded well to hamstring stretching and R hip flexor MET. Opted to hold off on TPDN to asess response to treatment and determine efficacy of activity. trialed E-stim due to pt thinking about purchasing a TENS unit. end of session she noted decreased pain / soreness.    PT Treatment/Interventions ADLs/Self Care Home Management;Cryotherapy;Electrical Stimulation;Moist Heat;Ultrasound;DME Instruction;Gait training;Stair training;Functional mobility training;Therapeutic activities;Therapeutic exercise;Balance training;Neuromuscular re-education;Patient/family education;Manual techniques;Passive range of motion;Dry needling;Taping;Vasopneumatic Device;Spinal Manipulations    PT Next Visit Plan Assess response to HEP and review as needed. TPDN/manual therapy for back pain. Continue stretches. Initiate core strengthening. Review seated and standing posture.    PT Home Exercise Plan Access Code [email or web address]    [email or web address] and Agree with Plan of Care Patient           Patient will benefit from skilled therapeutic intervention in order to improve the following deficits and impairments:  Decreased mobility,Increased muscle spasms,Decreased range of motion,Improper body mechanics,Decreased activity tolerance,Impaired flexibility,Postural dysfunction,Pain,Increased fascial restricitons  Visit Diagnosis: Right-sided low back pain without sciatica, unspecified chronicity  Abnormal posture     Problem List Patient Active Problem List   Diagnosis Date Noted   . Status post primary low transverse cesarean section 02/05/2015  . Preterm premature rupture of membranes (PPROM) with unknown onset of labor 01/26/2015    Starr Lake  PT, DPT, LAT, ATC  01/11/21  9:32 AM      [facility] [address] [place], [place], [address] Phone: [phone]   Fax:  [phone]  Name: Tkai Large MRN: [id number] Date of Birth: 22-Oct-1980

## 2021-01-16 ENCOUNTER — Other Ambulatory Visit: Payer: Self-pay

## 2021-01-16 ENCOUNTER — Ambulatory Visit: Payer: Medicaid Other | Admitting: Physical Therapy

## 2021-01-16 DIAGNOSIS — M545 Low back pain, unspecified: Secondary | ICD-10-CM | POA: Diagnosis not present

## 2021-01-16 DIAGNOSIS — R293 Abnormal posture: Secondary | ICD-10-CM

## 2021-01-16 NOTE — Therapy (Signed)
[place], [place], [address] Phone: [phone]   Fax:  [phone]  Physical Therapy Treatment  Patient Details  Name: Neftali Thurow MRN: [id number] Date of Birth: 02-28-1981 Referring Provider (PT): Emelia Loron, NP   Encounter Date: 01/16/2021   PT End of Session - 01/16/21 0845    Visit Number 4    Number of Visits 5    Date for PT Re-Evaluation 01/19/21    Authorization Type UHC MCD    Authorization - Visit Number 3    Authorization - Number of Visits 27    PT Start Time 0845    PT Stop Time 0930    PT Time Calculation (min) 45 min    Activity Tolerance Patient tolerated treatment well    Behavior During Therapy [facility] for tasks assessed/performed           Past Medical History:  Diagnosis Date  . H/O cone biopsy of cervix   . Hypothyroidism   . Preterm labor     Past Surgical History:  Procedure Laterality Date  . CESAREAN SECTION N/A 02/02/2015   Procedure: CESAREAN SECTION;  Surgeon: Janyth Pupa, DO;  Location: [facility];  Service: Obstetrics;  Laterality: N/A;  . EYE SURGERY    . GASTRIC BYPASS      There were no vitals filed for this visit.   Subjective Assessment - 01/16/21 0848    Subjective Pt states it's been back and forth with her back. Pt reports she got a TENS machine on Saturday; she hasn't had a chance to use it yet. Pt states it wasn't bad yesterday. It's not terrible -- some days okay and some days when it's not. She reports she did go 3 days in a row working 12 hour shifts    Limitations Sitting;Standing    How long can you sit comfortably? ~15 minutes    How long can you stand comfortably? ~30 minutes to an hour depending on what she's doing    Patient Stated Goals Decrease pain so she can play with her daughter    Currently in Pain? Yes    Pain Score 3     Pain Location Back    Pain Orientation Right                             [facility] Adult  PT Treatment/Exercise - 01/16/21 0001      Lumbar Exercises: Stretches   Active Hamstring Stretch 2 reps;30 seconds    Passive Hamstring Stretch 30 seconds;Right;4 reps    Passive Hamstring Stretch Limitations with contract/relax    Double Knee to Chest Stretch 30 seconds    Lower Trunk Rotation 20 seconds;2 reps    Pelvic Tilt 2 reps;10 seconds      Lumbar Exercises: Seated   Other Seated Lumbar Exercises Resisted hip flexion 2x5x5" hold      Lumbar Exercises: Supine   Straight Leg Raise 10 reps      Lumbar Exercises: Prone   Other Prone Lumbar Exercises Plank knee & elbow 2x20 sec      Modalities   Modalities Traction      Traction   Type of Traction Lumbar    Min (lbs) 0    Max (lbs) 80    Time 8      Manual Therapy   Manual Therapy Muscle Energy Technique;Joint mobilization    Soft tissue mobilization TPR paraspinals  Myofascial Release Paraspinals    Muscle Energy Technique Resisted R hip flexion 2 x5 holding 5 seconds                    PT Short Term Goals - 12/22/20 1223      PT SHORT TERM GOAL #1   Title STG = LTG             PT Long Term Goals - 12/22/20 1223      PT LONG TERM GOAL #1   Title Pt will be independent with HEP    Time 4    Period Weeks    Status New    Target Date 01/19/21      PT LONG TERM GOAL #2   Title Pt will report reduction in pain by at least 75%    Baseline 7/10 on initial eval    Time 4    Period Weeks    Status New    Target Date 01/19/21      PT LONG TERM GOAL #3   Title Pt will be able to tolerate sitting for at least 30 minutes    Baseline ~15 minutes before discomfort    Time 4    Period Weeks    Status New    Target Date 01/19/21      PT LONG TERM GOAL #4   Title Pt will have full lumbar ROM with no discomfort    Time 4    Period Weeks    Status New    Target Date 01/19/21                 Plan - 01/16/21 0930    Clinical Impression Statement Continued R>L hamstring tightness --  continued hamstring stretching and R hip flexor MET. Muscle tightness noted more along paraspinals than R QL addressed with manual therapy and traction. Continued core strengthening.    Personal Factors and Comorbidities Age;Time since onset of injury/illness/exacerbation    Examination-Activity Limitations Sleep;Caring for Others;Sit;Stand    Examination-Participation Restrictions Community Activity;Occupation;Yard Work;Cleaning;Interpersonal Relationship    Rehab Potential Good    PT Frequency 1x / week    PT Duration 4 weeks    PT Treatment/Interventions ADLs/Self Care Home Management;Cryotherapy;Electrical Stimulation;Moist Heat;Ultrasound;DME Instruction;Gait training;Stair training;Functional mobility training;Therapeutic activities;Therapeutic exercise;Balance training;Neuromuscular re-education;Patient/family education;Manual techniques;Passive range of motion;Dry needling;Taping;Vasopneumatic Device;Spinal Manipulations    PT Next Visit Plan Re-cert and LTGs. Assess response to HEP and review as needed. TPDN/manual therapy for back pain. Continue stretches. Continue core strengthening. Review seated and standing posture.    PT Home Exercise Plan Access Code [email or web address]    [email or web address] and Agree with Plan of Care Patient           Patient will benefit from skilled therapeutic intervention in order to improve the following deficits and impairments:  Decreased mobility,Increased muscle spasms,Decreased range of motion,Improper body mechanics,Decreased activity tolerance,Impaired flexibility,Postural dysfunction,Pain,Increased fascial restricitons  Visit Diagnosis: Right-sided low back pain without sciatica, unspecified chronicity  Abnormal posture     Problem List Patient Active Problem List   Diagnosis Date Noted  . Status post primary low transverse cesarean section 02/05/2015  . Preterm premature rupture of membranes (PPROM) with unknown onset of labor 01/26/2015    [facility] April  Gordy Levan PT, DPT 01/16/2021, 9:36 AM  [facility] [address] [place], [place], [address] Phone: [phone]   Fax:  [phone]  Name: Illene Sweeting MRN: [id number] Date of Birth: 1980/08/27

## 2021-01-23 ENCOUNTER — Other Ambulatory Visit: Payer: Self-pay

## 2021-01-23 ENCOUNTER — Encounter: Payer: Self-pay | Admitting: Physical Therapy

## 2021-01-23 ENCOUNTER — Ambulatory Visit: Payer: Medicaid Other | Attending: Nurse Practitioner | Admitting: Physical Therapy

## 2021-01-23 DIAGNOSIS — M545 Low back pain, unspecified: Secondary | ICD-10-CM | POA: Insufficient documentation

## 2021-01-23 DIAGNOSIS — R293 Abnormal posture: Secondary | ICD-10-CM | POA: Insufficient documentation

## 2021-01-23 NOTE — Therapy (Signed)
[place], [place], [address] Phone: [phone]   Fax:  [phone]  Physical Therapy Treatment / Re-certification  Patient Details  Name: Autumn Mclaughlin MRN: [id number] Date of Birth: November 17, 1980 Referring Provider (PT): Emelia Loron, NP   Encounter Date: 01/23/2021   PT End of Session - 01/23/21 0848    Visit Number 5    Number of Visits 9    Date for PT Re-Evaluation 02/27/21    Authorization Type UHC MCD    Authorization - Visit Number 4    Authorization - Number of Visits 27    PT Start Time 0848    PT Stop Time 0927    PT Time Calculation (min) 39 min    Activity Tolerance Patient tolerated treatment well    Behavior During Therapy [facility] for tasks assessed/performed           Past Medical History:  Diagnosis Date  . H/O cone biopsy of cervix   . Hypothyroidism   . Preterm labor     Past Surgical History:  Procedure Laterality Date  . CESAREAN SECTION N/A 02/02/2015   Procedure: CESAREAN SECTION;  Surgeon: Janyth Pupa, DO;  Location: [facility];  Service: Obstetrics;  Laterality: N/A;  . EYE SURGERY    . GASTRIC BYPASS      There were no vitals filed for this visit.   Subjective Assessment - 01/23/21 0848    Subjective "I feel like I am getting better, the last few days haven't been terrible I do get some soreness on the R todan and some on the L."    Patient Stated Goals Decrease pain so she can play with her daughter    Currently in Pain? Yes    Pain Score 3     Pain Location Back    Pain Orientation Right    Pain Descriptors / Indicators Aching    Pain Type Chronic pain    Pain Onset More than a month ago    Pain Frequency Intermittent    Aggravating Factors  working, prlonged siting    Pain Relieving Factors heat, walking              [facility] PT Assessment - 01/23/21 0001      Assessment   Medical Diagnosis M54.50 (ICD-10-CM) - Low back pain, unspecified    Referring  Provider (PT) Emelia Loron, NP      AROM   Lumbar Extension 90%    Lumbar - Left Side Bend 80%                         [facility] Adult PT Treatment/Exercise - 01/23/21 0001      Lumbar Exercises: Stretches   Active Hamstring Stretch 3 reps;30 seconds   PNF contract/ relax   Other Lumbar Stretch Exercise R QL stretch in L sidlying. 2 x 30      Lumbar Exercises: Aerobic   Nustep L5 x 5 min UE/LE      Lumbar Exercises: Supine   Straight Leg Raise 10 reps      Manual Therapy   Manual therapy comments skilled palpation and monitoring of pt throughout TPDN    Soft tissue mobilization IASTM along the R QL    Muscle Energy Technique Resisted R hip flexion 1 x 10 holding 5 seconds            Trigger Point Dry Needling - 01/23/21 0001    Consent Given?  Yes    Education Handout Provided Previously provided    Quadratus Lumborum Response Twitch response elicited;Palpable increased muscle length   R only               PT Education - 01/23/21 0903    Education Details Reviewed HEP and updated today. Reviewed POC working toward discharge    Person(s) Educated Patient    Methods Explanation;Verbal cues;Handout    Comprehension Verbalized understanding;Verbal cues required            PT Short Term Goals - 12/22/20 1223      PT SHORT TERM GOAL #1   Title STG = LTG             PT Long Term Goals - 01/23/21 5003      PT LONG TERM GOAL #1   Title Pt will be independent with HEP    Baseline inpendent progress as able.    Time 4    Period Weeks    Status On-going    Target Date 02/20/21      PT LONG TERM GOAL #2   Title Pt will report reduction in pain by at least 75%    Baseline today pain 3/10, on eval 7/10    Time 4    Period Weeks    Status Partially Met    Target Date 02/20/21      PT LONG TERM GOAL #3   Title Pt will be able to tolerate sitting for at least 30 minutes    Baseline able to do 1 hour    Status Achieved      PT LONG TERM GOAL  #4   Title Pt will have full lumbar ROM with no discomfort    Time 4    Period Weeks    Target Date 02/20/21                 Plan - 01/23/21 0906    Clinical Impression Statement Autumn Mclaughlin is making excellent progress with physical therapy reporting pain dropped to 3/10 today. She is making excellent progress toward her goals and is motivated to continue with treatment to improve her function. cotninued TPDN for the R QL followed with IASTM. continued addressing potential R SIJ with hamstring stretch and hip flexor MET. she would benefit from physical therapy to decreaesd low back pain pain, and maximize her function by addressin gthe deficits listed and work toward discharge.    PT Frequency 1x / week    PT Duration 4 weeks    PT Treatment/Interventions ADLs/Self Care Home Management;Cryotherapy;Electrical Stimulation;Moist Heat;Ultrasound;DME Instruction;Gait training;Stair training;Functional mobility training;Therapeutic activities;Therapeutic exercise;Balance training;Neuromuscular re-education;Patient/family education;Manual techniques;Passive range of motion;Dry needling;Taping;Vasopneumatic Device;Spinal Manipulations    PT Next Visit Plan Assess response to HEP and review as needed. TPDN/manual therapy for back pain. Continue stretches. Continue core strengthening. Review seated and standing posture. potential R SIJ    PT Home Exercise Plan Access Code [email or web address]    [email or web address] and Agree with Plan of Care Patient           Patient will benefit from skilled therapeutic intervention in order to improve the following deficits and impairments:  Decreased mobility,Increased muscle spasms,Decreased range of motion,Improper body mechanics,Decreased activity tolerance,Impaired flexibility,Postural dysfunction,Pain,Increased fascial restricitons  Visit Diagnosis: Right-sided low back pain without sciatica, unspecified chronicity  Abnormal posture     Problem List Patient Active  Problem List   Diagnosis Date Noted  . Status post primary low transverse cesarean section 02/05/2015  . Preterm  premature rupture of membranes (PPROM) with unknown onset of labor 01/26/2015    Starr Lake PT, DPT, LAT, ATC  01/23/21  9:30 AM      [place], [place], [address] Phone: [phone]   Fax:  [phone]  Name: Autumn Mclaughlin MRN: [id number] Date of Birth: 1981/02/01

## 2021-01-29 ENCOUNTER — Ambulatory Visit: Payer: Medicaid Other

## 2021-01-29 ENCOUNTER — Other Ambulatory Visit: Payer: Self-pay

## 2021-01-29 DIAGNOSIS — M545 Low back pain, unspecified: Secondary | ICD-10-CM | POA: Diagnosis not present

## 2021-01-29 DIAGNOSIS — R293 Abnormal posture: Secondary | ICD-10-CM

## 2021-01-29 NOTE — Therapy (Addendum)
[place], [place], [address] Phone: [phone]   Fax:  [phone]  Physical Therapy Treatment / Discharge  Patient Details  Name: Autumn Mclaughlin MRN: [id number] Date of Birth: April 16, 1981 Referring Provider (PT): Emelia Loron, NP  PHYSICAL THERAPY DISCHARGE SUMMARY  Visits from Start of Care: 12/22/20  Current functional level related to goals / functional outcomes: Unable to assess due to non-return   Remaining deficits: Unable to assess due to non-return   Education / Equipment: HEP   Patient agrees to discharge. Patient goals were partially met. Patient is being discharged due to not returning since the last visit.  Encounter Date: 01/29/2021   PT End of Session - 01/29/21 0805     Visit Number 6    Number of Visits 9    Date for PT Re-Evaluation 02/27/21    Authorization Type [employer] MCD    Authorization - Visit Number 4    Authorization - Number of Visits 27    PT Start Time 0800    PT Stop Time 0841    PT Time Calculation (min) 41 min    Activity Tolerance Patient tolerated treatment well    Behavior During Therapy WFL for tasks assessed/performed             Past Medical History:  Diagnosis Date   H/O cone biopsy of cervix    Hypothyroidism    Preterm labor     Past Surgical History:  Procedure Laterality Date   CESAREAN SECTION N/A 02/02/2015   Procedure: CESAREAN SECTION;  Surgeon: Janyth Pupa, DO;  Location: [facility];  Service: Obstetrics;  Laterality: N/A;   EYE SURGERY     GASTRIC BYPASS      There were no vitals filed for this visit.   Subjective Assessment - 01/29/21 0804     Subjective The pt reports that she woke up this morning with a sharp shooting right sided low back pain rated at a 5/10    How long can you sit comfortably? 15-30 minutes    Currently in Pain? Yes    Pain Score 5     Pain Location Back    Pain Orientation Right    Pain Descriptors /  Indicators Sharp;Shooting                [facility] PT Assessment - 01/29/21 0001       Assessment   Medical Diagnosis M54.50 (ICD-10-CM) - Low back pain, unspecified    Referring Provider (PT) Emelia Loron, NP                           [facility] Adult PT Treatment/Exercise - 01/29/21 0001       Lumbar Exercises: Stretches   Other Lumbar Stretch Exercise R QL stretch in L sidlying. 2 x 30      Lumbar Exercises: Supine   Clam 5 seconds;15 reps;Other (comment)   Green band   Bridge with [employer] 15 reps;5 seconds    Basic Lumbar Stabilization 10 reps    Advanced Lumbar Stabilization Limitations hooklying ball squeeze w/ unilat knee ext      Manual Therapy   Manual therapy comments skilled palpation and monitoring of pt throughout TPDN    Soft tissue mobilization STM R lumbar paraspinals and QL    Manual Traction B LE in supine grade II    Muscle Energy Technique Resisted Hip Flexion  Trigger Point Dry Needling - 01/29/21 0001     Consent Given? Yes    Muscles Treated Back/Hip Quadratus lumborum    Quadratus Lumborum Response Twitch response elicited                  PT Education - 01/29/21 0841     Education Details standing QL stretch for at work, post FDN soreness    Person(s) Educated Patient    Methods Explanation    Comprehension Verbalized understanding              PT Short Term Goals - 12/22/20 1223       PT SHORT TERM GOAL #1   Title STG = LTG               PT Long Term Goals - 01/23/21 3567       PT LONG TERM GOAL #1   Title Pt will be independent with HEP    Baseline inpendent progress as able.    Time 4    Period Weeks    Status On-going    Target Date 02/20/21      PT LONG TERM GOAL #2   Title Pt will report reduction in pain by at least 75%    Baseline today pain 3/10, on eval 7/10    Time 4    Period Weeks    Status Partially Met    Target Date 02/20/21      PT LONG TERM GOAL #3    Title Pt will be able to tolerate sitting for at least 30 minutes    Baseline able to do 1 hour    Status Achieved      PT LONG TERM GOAL #4   Title Pt will have full lumbar ROM with no discomfort    Time 4    Period Weeks    Target Date 02/20/21                   Plan - 01/29/21 0829     Clinical Impression Statement Autumn Mclaughlin reports today waking up with right sided low back pain.  It was rated at 5/10.  Continued with FDN to the right QL.  She did get the twitch response. Performed STM, MET, and bilateral manual traction post the dry needling today.  Continued with core and pelvic floor stabilization exercises.  Progressed with the bridge, Bilat hip abd, and unilat knee extension in hooklying.  Autumn Mclaughlin reports increase of sitting tolerance to 15-30 minutes.  The patient would continue to benefit from physical therapy to reduce pain and return to premorbid state.    Personal Factors and Comorbidities Age;Time since onset of injury/illness/exacerbation    Examination-Activity Limitations Sleep;Caring for Others;Sit;Stand    Examination-Participation Restrictions Community Activity;Occupation;Yard Work;Cleaning;Interpersonal Relationship    PT Treatment/Interventions ADLs/Self Care Home Management;Cryotherapy;Electrical Stimulation;Moist Heat;Ultrasound;DME Instruction;Gait training;Stair training;Functional mobility training;Therapeutic activities;Therapeutic exercise;Balance training;Neuromuscular re-education;Patient/family education;Manual techniques;Passive range of motion;Dry needling;Taping;Vasopneumatic Device;Spinal Manipulations    PT Next Visit Plan . TPDN/manual therapy for back pain. Continue stretches. Continue core strengthening. Review seated and standing posture. potential R SIJ    PT Home Exercise Plan Access Code [email or web address]    THYHOOILN and Agree with Plan of Care Patient             Patient will benefit from skilled therapeutic intervention in order to improve  the following deficits and impairments:  Decreased mobility, Increased muscle spasms, Decreased range of motion, Improper body mechanics, Decreased activity tolerance, Impaired flexibility,  Postural dysfunction, Pain, Increased fascial restricitons  Visit Diagnosis: Right-sided low back pain without sciatica, unspecified chronicity  Abnormal posture     Problem List Patient Active Problem List   Diagnosis Date Noted   Status post primary low transverse cesarean section 02/05/2015   Preterm premature rupture of membranes (PPROM) with unknown onset of labor 01/26/2015   Rich Number, PT, DPT, OCS, Crt. DN Bethena Midget 01/29/2021, 8:43 AM   03/22/21:  The patient has not returned to therapy since 01/29/21.  Unable to update Objectives.  She will be discharged due to non-return. Rich Number, PT, DPT, OCS, Crt. DN 03/22/21, 1:01pm  [place], [place], [address] Phone: [phone]   Fax:  [phone]  Name: Autumn Mclaughlin MRN: [id number] Date of Birth: Dec 23, 1980

## 2021-02-07 ENCOUNTER — Ambulatory Visit: Payer: Medicaid Other | Admitting: Physical Therapy

## 2021-02-12 ENCOUNTER — Telehealth: Payer: Self-pay

## 2021-02-12 ENCOUNTER — Ambulatory Visit: Payer: Medicaid Other

## 2021-02-12 NOTE — Telephone Encounter (Signed)
Called the patient and left voicemail due to her missing her appointment today.  Reminded her of her next appointment.  Sharol Roussel, PT, DPT, OCS, Crt. DN

## 2021-02-20 ENCOUNTER — Ambulatory Visit: Payer: Medicaid Other | Attending: Nurse Practitioner

## 2021-02-20 ENCOUNTER — Telehealth: Payer: Self-pay

## 2021-02-20 NOTE — Telephone Encounter (Signed)
Autumn Mclaughlin did not show for her appointment today and she no more remaining physical therapy visits scheduled.  I called her and left a voice mail stating that she will be only able to schedule one visit at a time per the attendance policy.  Sharol Roussel, PT, DPT, OCS, Crt. DN

## 2021-05-28 ENCOUNTER — Ambulatory Visit: Payer: Medicaid Other | Admitting: Psychology

## 2021-06-04 ENCOUNTER — Ambulatory Visit: Payer: Medicaid Other | Admitting: Psychology

## 2021-06-11 ENCOUNTER — Ambulatory Visit: Payer: Medicaid Other | Admitting: Psychology

## 2021-06-18 ENCOUNTER — Ambulatory Visit: Payer: Medicaid Other | Admitting: Psychology

## 2021-06-26 NOTE — H&P (Signed)
Autumn Mclaughlin is an 40 y.o. [phone] who is admitted for Laparoscopic bilateral salpingectomy for desire for permanent sterilization.  She his 100% positive that she no longer desires future fertility. Currently using transdermal patch for contraception. Declines LARCs and vasectomy. Has previously used Mirena IUD, OCPs, and Depo Provera.  Patient Active Problem List   Diagnosis Date Noted   Status post primary low transverse cesarean section 02/05/2015   Preterm premature rupture of membranes (PPROM) with unknown onset of labor 01/26/2015    MEDICAL/FAMILY/SOCIAL HX: No LMP recorded.    Past Medical History:  Diagnosis Date   H/O cone biopsy of cervix    Hypothyroidism    Preterm labor     Past Surgical History:  Procedure Laterality Date   CESAREAN SECTION N/A 02/02/2015   Procedure: CESAREAN SECTION;  Surgeon: Myna Hidalgo, DO;  Location: [facility];  Service: Obstetrics;  Laterality: N/A;   EYE SURGERY     GASTRIC BYPASS      Family History  Problem Relation Age of Onset   Diabetes Maternal Grandfather    Asthma Sister    Cancer Sister    Cancer Maternal Grandmother    Cancer Paternal Grandmother    Thyroid disease Mother    Cancer Father     Social History:  reports that she has been smoking cigarettes. She has been smoking an average of 1 pack per day. She has never used smokeless tobacco. She reports that she does not drink alcohol and does not use drugs.  ALLERGIES/MEDS:  Allergies:  Allergies  Allergen Reactions   Nsaids     Had gastric bypass surgery    Sulfa Antibiotics Other (See Comments)    Reaction: unkown    No medications prior to admission.     Review of Systems  Constitutional: Negative.   HENT: Negative.    Eyes: Negative.   Respiratory: Negative.    Cardiovascular: Negative.   Gastrointestinal: Negative.   Genitourinary: Negative.   Musculoskeletal: Negative.   Skin: Negative.   Neurological: Negative.   Endo/Heme/Allergies:  Negative.   Psychiatric/Behavioral: Negative.     There were no vitals taken for this visit. Gen:  NAD, pleasant and cooperative Cardio:  RRR Pulm:  CTAB, no wheezes/rales/rhonchi Abd:  Soft, non-distended, non-tender throughout, no rebound/guarding Ext:  No bilateral LE edema, no bilateral calf tenderness  No results found for this or any previous visit (from the past 24 hour(s)).  No results found.   ASSESSMENT/PLAN: Autumn Mclaughlin is a 40 y.o. [id number] who is admitted for Laparoscopic bilateral salpingectomy for desire for permanent sterilization.  - Admit to [facility] - Admit labs (CBC, T&S, COVID screen, urine HCG) - Diet:  NPO - IVF:  Per anesthesia - VTE Prophylaxis:  SCDs - Antibiotics: None - D/C home same day  Consents: Patient was counseled on the risks, benefits and alternatives of bilateral tubal sterilization.  Like any surgery this procedure carries risks including to but not limited to infection, damage to surrounding structures requiring additional surgery, thromboembolism, and even death.  She understands that there is a failure rate of approximently 1-2%, and if she were to get pregnant after a bilateral tubal sterilization, there is a 50% chance of ectopic pregnancy.  She was advised that if she should miss her period andor have a positive pregnancy test after her bilateral tubal sterilization, she should seek medical assistance.  She is aware that this is a permanent procedure and that reversal is available but is very expensive and only  50% effective.  I have discussed with the patient that this surgery is performed to provide permanent female sterilization by removing the entirety of each fallopian tube.  Prior to surgery, the risks and benefits of the surgery, as well as alternative treatments, have been discussed.  The risks of proceeding with this surgery include, but are not limited to, bleeding, including the need for a blood transfusion, infection,  damage to surrounding organs and tissues, damage to bladder, damage to ureters, causing kidney damage, and requiring additional procedures, damage to bowels, resulting in further surgery, postoperative pain, short-term and long-term, scarring on the abdominal wall and intra-abdominally, need for further surgery, development of an incisional hernia, need for an open procedure, deep vein thrombosis and/or pulmonary embolism, wound infection and/or separation, painful intercourse, failure of the procedure to prevent pregnancy, increased risk of ectopic pregnancy requiring surgery if procedure failure occurs complications the course of which cannot be predicted or prevented, and death. Patient was consented for blood products.  The patient is aware that bleeding may result in the need for a blood transfusion which includes risk of transmission of HIV (1:2 million), Hepatitis C (1:2 million), and Hepatitis B (1:200 thousand) and transfusion reaction.  Patient voiced understanding of the above risks as well as understanding of indications for blood transfusion.   Steva Ready, DO [phone] (office)

## 2021-07-02 ENCOUNTER — Ambulatory Visit (INDEPENDENT_AMBULATORY_CARE_PROVIDER_SITE_OTHER): Payer: Medicaid Other | Admitting: Psychology

## 2021-07-02 DIAGNOSIS — F411 Generalized anxiety disorder: Secondary | ICD-10-CM

## 2021-07-02 DIAGNOSIS — F331 Major depressive disorder, recurrent, moderate: Secondary | ICD-10-CM | POA: Diagnosis not present

## 2021-07-04 ENCOUNTER — Other Ambulatory Visit: Payer: Self-pay

## 2021-07-04 ENCOUNTER — Encounter (HOSPITAL_BASED_OUTPATIENT_CLINIC_OR_DEPARTMENT_OTHER): Payer: Self-pay | Admitting: Obstetrics and Gynecology

## 2021-07-09 ENCOUNTER — Encounter (HOSPITAL_BASED_OUTPATIENT_CLINIC_OR_DEPARTMENT_OTHER)
Admission: RE | Admit: 2021-07-09 | Discharge: 2021-07-09 | Disposition: A | Discharge status: Home / Self Care | Payer: Medicaid Other | Source: Ambulatory Visit | Attending: Obstetrics and Gynecology | Admitting: Obstetrics and Gynecology

## 2021-07-09 DIAGNOSIS — Z01812 Encounter for preprocedural laboratory examination: Secondary | ICD-10-CM | POA: Diagnosis present

## 2021-07-09 DIAGNOSIS — Z302 Encounter for sterilization: Secondary | ICD-10-CM | POA: Diagnosis present

## 2021-07-09 LAB — TYPE AND SCREEN
ABO/RH(D): AB POS
Antibody Screen: NEGATIVE

## 2021-07-09 LAB — CBC
HCT: 43.7 % (ref 36.0–46.0)
Hemoglobin: 14.8 g/dL (ref 12.0–15.0)
MCH: 30.5 pg (ref 26.0–34.0)
MCHC: 33.9 g/dL (ref 30.0–36.0)
MCV: 90.1 fL (ref 80.0–100.0)
Platelets: 193 10*3/uL (ref 150–400)
RBC: 4.85 MIL/uL (ref 3.87–5.11)
RDW: 11.4 % — ABNORMAL LOW (ref 11.5–15.5)
WBC: 6.9 10*3/uL (ref 4.0–10.5)
nRBC: 0 % (ref 0.0–0.2)

## 2021-07-09 LAB — POCT PREGNANCY, URINE: Preg Test, Ur: NEGATIVE

## 2021-07-09 NOTE — Progress Notes (Signed)

## 2021-07-11 ENCOUNTER — Ambulatory Visit (HOSPITAL_BASED_OUTPATIENT_CLINIC_OR_DEPARTMENT_OTHER): Payer: Medicaid Other | Admitting: Anesthesiology

## 2021-07-11 ENCOUNTER — Other Ambulatory Visit: Payer: Self-pay

## 2021-07-11 ENCOUNTER — Ambulatory Visit (HOSPITAL_BASED_OUTPATIENT_CLINIC_OR_DEPARTMENT_OTHER)
Admission: RE | Admit: 2021-07-11 | Discharge: 2021-07-11 | Disposition: A | Discharge status: Home / Self Care | Payer: Medicaid Other | Attending: Obstetrics and Gynecology | Admitting: Obstetrics and Gynecology

## 2021-07-11 ENCOUNTER — Encounter (HOSPITAL_BASED_OUTPATIENT_CLINIC_OR_DEPARTMENT_OTHER): Payer: Self-pay | Admitting: Obstetrics and Gynecology

## 2021-07-11 ENCOUNTER — Encounter (HOSPITAL_BASED_OUTPATIENT_CLINIC_OR_DEPARTMENT_OTHER)
Admission: RE | Disposition: A | Discharge status: Home / Self Care | Payer: Self-pay | Source: Home / Self Care | Attending: Obstetrics and Gynecology

## 2021-07-11 DIAGNOSIS — Z302 Encounter for sterilization: Secondary | ICD-10-CM

## 2021-07-11 HISTORY — PX: LAPAROSCOPIC BILATERAL SALPINGECTOMY: SHX5889

## 2021-07-11 SURGERY — SALPINGECTOMY, BILATERAL, LAPAROSCOPIC
Anesthesia: General | Laterality: Bilateral

## 2021-07-11 MED ORDER — OXYCODONE HCL 5 MG/5ML PO SOLN: 5.0000 mg | Freq: Once | ORAL | Status: AC | PRN

## 2021-07-11 MED ORDER — OXYCODONE HCL 5 MG PO TABS
5.0000 mg | ORAL_TABLET | Freq: Once | ORAL | Status: AC | PRN
  Administered 2021-07-11: 5 mg via ORAL

## 2021-07-11 MED ORDER — LACTATED RINGERS IV SOLN: INTRAVENOUS | Status: DC

## 2021-07-11 MED ORDER — EPHEDRINE SULFATE 50 MG/ML IJ SOLN
INTRAMUSCULAR | Status: DC | PRN
  Administered 2021-07-11: 10 mg via INTRAVENOUS

## 2021-07-11 MED ORDER — ROCURONIUM BROMIDE 100 MG/10ML IV SOLN
INTRAVENOUS | Status: DC | PRN
  Administered 2021-07-11: 80 mg via INTRAVENOUS

## 2021-07-11 MED ORDER — OXYCODONE HCL 5 MG PO TABS
ORAL_TABLET | ORAL | Status: AC
  Filled 2021-07-11: qty 1

## 2021-07-11 MED ORDER — SUGAMMADEX SODIUM 500 MG/5ML IV SOLN
INTRAVENOUS | Status: DC | PRN
  Administered 2021-07-11: 400 mg via INTRAVENOUS

## 2021-07-11 MED ORDER — ONDANSETRON HCL 4 MG/2ML IJ SOLN
INTRAMUSCULAR | Status: DC | PRN
  Administered 2021-07-11: 4 mg via INTRAVENOUS

## 2021-07-11 MED ORDER — MIDAZOLAM HCL 5 MG/5ML IJ SOLN
INTRAMUSCULAR | Status: DC | PRN
  Administered 2021-07-11: 2 mg via INTRAVENOUS

## 2021-07-11 MED ORDER — ALBUTEROL SULFATE HFA 108 (90 BASE) MCG/ACT IN AERS
INHALATION_SPRAY | RESPIRATORY_TRACT | Status: DC | PRN
  Administered 2021-07-11 (×2): 2 via RESPIRATORY_TRACT

## 2021-07-11 MED ORDER — HYDROMORPHONE HCL 1 MG/ML IJ SOLN
0.2500 mg | INTRAMUSCULAR | Status: DC | PRN
  Administered 2021-07-11: 0.5 mg via INTRAVENOUS

## 2021-07-11 MED ORDER — FENTANYL CITRATE (PF) 100 MCG/2ML IJ SOLN
INTRAMUSCULAR | Status: AC
  Filled 2021-07-11: qty 2

## 2021-07-11 MED ORDER — PROMETHAZINE HCL 25 MG/ML IJ SOLN: 6.2500 mg | INTRAMUSCULAR | Status: DC | PRN

## 2021-07-11 MED ORDER — OXYCODONE HCL 5 MG PO TABS: 10.0000 mg | ORAL_TABLET | Freq: Four times a day (QID) | ORAL | 0 refills | Status: AC | PRN

## 2021-07-11 MED ORDER — MEPERIDINE HCL 25 MG/ML IJ SOLN: 6.2500 mg | INTRAMUSCULAR | Status: DC | PRN

## 2021-07-11 MED ORDER — SUGAMMADEX SODIUM 500 MG/5ML IV SOLN
INTRAVENOUS | Status: AC
  Filled 2021-07-11: qty 5

## 2021-07-11 MED ORDER — LIDOCAINE HCL (CARDIAC) PF 100 MG/5ML IV SOSY
PREFILLED_SYRINGE | INTRAVENOUS | Status: DC | PRN
  Administered 2021-07-11: 100 mg via INTRAVENOUS

## 2021-07-11 MED ORDER — FENTANYL CITRATE (PF) 100 MCG/2ML IJ SOLN
INTRAMUSCULAR | Status: DC | PRN
  Administered 2021-07-11: 100 ug via INTRAVENOUS

## 2021-07-11 MED ORDER — MIDAZOLAM HCL 2 MG/2ML IJ SOLN
INTRAMUSCULAR | Status: AC
  Filled 2021-07-11: qty 2

## 2021-07-11 MED ORDER — HYDROMORPHONE HCL 1 MG/ML IJ SOLN
INTRAMUSCULAR | Status: AC
  Filled 2021-07-11: qty 0.5

## 2021-07-11 MED ORDER — ONDANSETRON HCL 4 MG/2ML IJ SOLN
INTRAMUSCULAR | Status: AC
  Filled 2021-07-11: qty 2

## 2021-07-11 MED ORDER — PROPOFOL 10 MG/ML IV BOLUS
INTRAVENOUS | Status: AC
  Filled 2021-07-11: qty 20

## 2021-07-11 MED ORDER — ALBUTEROL SULFATE HFA 108 (90 BASE) MCG/ACT IN AERS
INHALATION_SPRAY | RESPIRATORY_TRACT | Status: AC
  Filled 2021-07-11: qty 6.7

## 2021-07-11 MED ORDER — DEXAMETHASONE SODIUM PHOSPHATE 4 MG/ML IJ SOLN
INTRAMUSCULAR | Status: DC | PRN
  Administered 2021-07-11: 4 mg via INTRAVENOUS

## 2021-07-11 MED ORDER — BUPIVACAINE HCL (PF) 0.25 % IJ SOLN
INTRAMUSCULAR | Status: DC | PRN
  Administered 2021-07-11: 30 mL

## 2021-07-11 MED ORDER — AMISULPRIDE (ANTIEMETIC) 5 MG/2ML IV SOLN: 10.0000 mg | Freq: Once | INTRAVENOUS | Status: DC | PRN

## 2021-07-11 MED ORDER — EPHEDRINE 5 MG/ML INJ
INTRAVENOUS | Status: AC
  Filled 2021-07-11: qty 5

## 2021-07-11 MED ORDER — PROPOFOL 10 MG/ML IV BOLUS
INTRAVENOUS | Status: DC | PRN
  Administered 2021-07-11: 150 mg via INTRAVENOUS
  Administered 2021-07-11 (×2): 50 mg via INTRAVENOUS

## 2021-07-11 SURGICAL SUPPLY — 23 items
ADH SKN CLS APL DERMABOND .7 (GAUZE/BANDAGES/DRESSINGS) ×1
DERMABOND ADVANCED (GAUZE/BANDAGES/DRESSINGS) ×2
DERMABOND ADVANCED .7 DNX12 (GAUZE/BANDAGES/DRESSINGS) ×1 IMPLANT
DURAPREP 26ML APPLICATOR (WOUND CARE) ×3 IMPLANT
GAUZE 4X4 16PLY ~~LOC~~+RFID DBL (SPONGE) ×3 IMPLANT
GLOVE SURG ENC MOIS LTX SZ6.5 (GLOVE) ×6 IMPLANT
GLOVE SURG UNDER POLY LF SZ6.5 (GLOVE) ×3 IMPLANT
GLOVE SURG UNDER POLY LF SZ7 (GLOVE) ×6 IMPLANT
GOWN STRL REUS W/ TWL LRG LVL3 (GOWN DISPOSABLE) ×2 IMPLANT
GOWN STRL REUS W/TWL LRG LVL3 (GOWN DISPOSABLE) ×9
LIGASURE VESSEL 5MM BLUNT TIP (ELECTROSURGICAL) ×3 IMPLANT
NS IRRIG 1000ML POUR BTL (IV SOLUTION) ×3 IMPLANT
PACK LAPAROSCOPY BASIN (CUSTOM PROCEDURE TRAY) ×3 IMPLANT
PACK TRENDGUARD 450 HYBRID PRO (MISCELLANEOUS) IMPLANT
PAD OB MATERNITY 4.3X12.25 (PERSONAL CARE ITEMS) ×3 IMPLANT
SET TUBE SMOKE EVAC HIGH FLOW (TUBING) ×3 IMPLANT
SLEEVE ENDOPATH XCEL 5M (ENDOMECHANICALS) ×5 IMPLANT
SUT VICRYL 4-0 PS2 18IN ABS (SUTURE) ×3 IMPLANT
TOWEL GREEN STERILE FF (TOWEL DISPOSABLE) ×4 IMPLANT
TRAY FOLEY W/BAG SLVR 14FR LF (SET/KITS/TRAYS/PACK) ×3 IMPLANT
TRENDGUARD 450 HYBRID PRO PACK (MISCELLANEOUS) ×3
TROCAR XCEL NON-BLD 5MMX100MML (ENDOMECHANICALS) ×3 IMPLANT
WARMER LAPAROSCOPE (MISCELLANEOUS) ×3 IMPLANT

## 2021-07-11 NOTE — Op Note (Signed)
Pre Op Dx:   Desires permanent sterilization  Post Op Dx:   Same as pre-op  Procedure:   Laparoscopic bilateral salpingectomy  Surgeon:  Dr. Steva Ready Assistants:  Dr. Gerald Leitz (assistant needed due to the complexity of the anatomy Anesthesia:  General   EBL:  5cc  IVF:  750cc UOP:  See anesthesia documentation   Drains:  Foley catheter Specimen removed:  Bilateral fallopian tubes - sent to pathology Device(s) implanted: None Case Type:  Clean Findings:  Normal-appearing uterus, bilateral fallopian tubes, and ovaries Complications: None Indications:  40 y.o. [id number] who desired permanent sterilization. Description of each procedure:  After informed consent the patient was taken to the operating room where general endotracheal anesthesia was administered and found to be adequate. She was placed in dorsal lithotomy position. She was prepped and draped in the usual sterile fashion. An operative timeout was performed. A Hulka tenaculum was placed in the cervix and her bladder was drained. The abdomen was entered through an intraumbilical incision using a 100mm  trocar under direct visualization and a pneumoperitoneum was established. Bilateral lower quadrant 75mm ports were placed under direct visualization and atraumatic entry confirmed. The right fallopian tube was divided from the mesosalpinx using the Ligasure device. Adequate hemostasis noted. This process was completed on the contralateral side. Hemostasis confirmed. The bilateral quadrant ports were withdrawn under visualization and the pneumoperitoneum was reduced completely, assisted with the patient having two deep breaths. The camera port was then withdrawn under visualization. The skin was closed with 4-0 Vicryl in subcuticular fashion. The tenaculum was removed and cervical hemostasis confirmed. The Hulka Tenaculum was removed and silver nitrate applied to the tenaculum sites. Foley catheter removed. The patient was returned to  dorsal supine position, awakened and extubated in the OR having appeared to tolerate the procedure well. All sponge, needle, and instrument counts were correct x 2 at the end of the case.  Disposition:  PACU  Steva Ready, DO

## 2021-07-11 NOTE — Interval H&P Note (Signed)
History and Physical Interval Note:  07/11/2021 1:46 PM  Autumn Mclaughlin  has presented today for surgery, with the diagnosis of Sterilization.  The various methods of treatment have been discussed with the patient and family. After consideration of risks, benefits and other options for treatment, the patient has consented to  Procedure(s): LAPAROSCOPIC BILATERAL SALPINGECTOMY (Bilateral) as a surgical intervention.  The patient's history has been reviewed, patient examined, no change in status, stable for surgery.  I have reviewed the patient's chart and labs.  Questions were answered to the patient's satisfaction.     Steva Ready

## 2021-07-11 NOTE — Anesthesia Postprocedure Evaluation (Signed)
Anesthesia Post Note  Patient: Autumn Mclaughlin  Procedure(s) Performed: LAPAROSCOPIC BILATERAL SALPINGECTOMY (Bilateral)     Patient location during evaluation: PACU Anesthesia Type: General Level of consciousness: sedated Pain management: pain level controlled Vital Signs Assessment: post-procedure vital signs reviewed and stable Respiratory status: spontaneous breathing and respiratory function stable Cardiovascular status: stable Postop Assessment: no apparent nausea or vomiting Anesthetic complications: no   No notable events documented.  Last Vitals:  Vitals:   07/11/21 1545 07/11/21 1600  BP: 101/63 106/74  Pulse: 86 80  Resp: 18 12  Temp:    SpO2: 96% 99%    Last Pain:  Vitals:   07/11/21 1600  TempSrc:   PainSc: 4                  Avianah Pellman DANIEL

## 2021-07-11 NOTE — Anesthesia Preprocedure Evaluation (Addendum)
Anesthesia Evaluation  Patient identified by MRN, date of birth, ID band Patient awake    Reviewed: Allergy & Precautions, NPO status , Patient's Chart, lab work & pertinent test resultsPreop documentation limited or incomplete due to emergent nature of procedure.  History of Anesthesia Complications Negative for: history of anesthetic complications  Airway Mallampati: III  TM Distance: >3 FB Neck ROM: Full    Dental no notable dental hx. (+) Dental Advisory Given   Pulmonary Current Smoker, former smoker,    Pulmonary exam normal breath sounds clear to auscultation       Cardiovascular negative cardio ROS Normal cardiovascular exam Rhythm:Regular Rate:Normal     Neuro/Psych negative neurological ROS  negative psych ROS   GI/Hepatic negative GI ROS, Neg liver ROS,   Endo/Other  Hypothyroidism   Renal/GU negative Renal ROS  negative genitourinary   Musculoskeletal negative musculoskeletal ROS (+)   Abdominal (+) + obese,   Peds negative pediatric ROS (+)  Hematology negative hematology ROS (+)   Anesthesia Other Findings   Reproductive/Obstetrics                            Anesthesia Physical  Anesthesia Plan  ASA: 2  Anesthesia Plan: General   Post-op Pain Management:    Induction: Intravenous  PONV Risk Score and Plan: 2 and Ondansetron, Midazolam and Treatment may vary due to age or medical condition  Airway Management Planned: Oral ETT  Additional Equipment:   Intra-op Plan:   Post-operative Plan: Extubation in OR  Informed Consent: I have reviewed the patients History and Physical, chart, labs and discussed the procedure including the risks, benefits and alternatives for the proposed anesthesia with the patient or authorized representative who has indicated his/her understanding and acceptance.     Dental advisory given  Plan Discussed with: CRNA  Anesthesia Plan  Comments:         Anesthesia Quick Evaluation

## 2021-07-11 NOTE — Discharge Instructions (Addendum)
  Post Anesthesia Home Care Instructions  Activity: Get plenty of rest for the remainder of the day. A responsible individual must stay with you for 24 hours following the procedure.  For the next 24 hours, DO NOT: -Drive a car -Advertising copywriter -Drink alcoholic beverages -Take any medication unless instructed by your physician -Make any legal decisions or sign important papers.  Meals: Start with liquid foods such as gelatin or soup. Progress to regular foods as tolerated. Avoid greasy, spicy, heavy foods. If nausea and/or vomiting occur, drink only clear liquids until the nausea and/or vomiting subsides. Call your physician if vomiting continues.  Special Instructions/Symptoms: Your throat may feel dry or sore from the anesthesia or the breathing tube placed in your throat during surgery. If this causes discomfort, gargle with warm salt water. The discomfort should disappear within 24 hours.  If you had a scopolamine patch placed behind your ear for the management of post- operative nausea and/or vomiting:  1. The medication in the patch is effective for 72 hours, after which it should be removed.  Wrap patch in a tissue and discard in the trash. Wash hands thoroughly with soap and water. 2. You may remove the patch earlier than 72 hours if you experience unpleasant side effects which may include dry mouth, dizziness or visual disturbances. 3. Avoid touching the patch. Wash your hands with soap and water after contact with the patch.     Oxycodone 5 mg given at 4:12 pm

## 2021-07-11 NOTE — Anesthesia Procedure Notes (Signed)
Procedure Name: Intubation Date/Time: 07/11/2021 2:30 PM Performed by: Thornell Mule, CRNA Pre-anesthesia Checklist: Patient identified, Emergency Drugs available, Suction available and Patient being monitored Patient Re-evaluated:Patient Re-evaluated prior to induction Oxygen Delivery Method: Circle system utilized Preoxygenation: Pre-oxygenation with 100% oxygen Induction Type: IV induction Ventilation: Mask ventilation without difficulty Laryngoscope Size: Miller and 3 Grade View: Grade I Tube type: Oral Tube size: 7.0 mm Number of attempts: 1 Airway Equipment and Method: Stylet and Oral airway Placement Confirmation: ETT inserted through vocal cords under direct vision, positive ETCO2 and breath sounds checked- equal and bilateral Secured at: 20 cm Tube secured with: Tape Dental Injury: Teeth and Oropharynx as per pre-operative assessment

## 2021-07-11 NOTE — Transfer of Care (Signed)
Immediate Anesthesia Transfer of Care Note  Patient: Autumn Mclaughlin  Procedure(s) Performed: LAPAROSCOPIC BILATERAL SALPINGECTOMY (Bilateral)  Patient Location: PACU  Anesthesia Type:General  Level of Consciousness: drowsy, patient cooperative and responds to stimulation  Airway & Oxygen Therapy: Patient Spontanous Breathing and Patient connected to face mask oxygen  Post-op Assessment: Report given to RN and Post -op Vital signs reviewed and stable  Post vital signs: Reviewed and stable  Last Vitals:  Vitals Value Taken Time  BP    Temp    Pulse 104 07/11/21 1524  Resp 21 07/11/21 1524  SpO2 99 % 07/11/21 1524  Vitals shown include unvalidated device data.  Last Pain:  Vitals:   07/11/21 1234  TempSrc: Oral  PainSc: 0-No pain      Patients Stated Pain Goal: 3 (07/11/21 1234)  Complications: No notable events documented.

## 2021-07-13 LAB — SURGICAL PATHOLOGY

## 2021-07-16 ENCOUNTER — Ambulatory Visit (INDEPENDENT_AMBULATORY_CARE_PROVIDER_SITE_OTHER): Payer: Medicaid Other | Admitting: Psychology

## 2021-07-16 ENCOUNTER — Encounter (HOSPITAL_BASED_OUTPATIENT_CLINIC_OR_DEPARTMENT_OTHER): Payer: Self-pay | Admitting: Obstetrics and Gynecology

## 2021-07-16 DIAGNOSIS — F331 Major depressive disorder, recurrent, moderate: Secondary | ICD-10-CM | POA: Diagnosis not present

## 2021-07-16 DIAGNOSIS — F411 Generalized anxiety disorder: Secondary | ICD-10-CM

## 2021-07-30 ENCOUNTER — Ambulatory Visit (INDEPENDENT_AMBULATORY_CARE_PROVIDER_SITE_OTHER): Payer: Medicaid Other | Admitting: Psychology

## 2021-07-30 DIAGNOSIS — F331 Major depressive disorder, recurrent, moderate: Secondary | ICD-10-CM | POA: Diagnosis not present

## 2021-07-30 DIAGNOSIS — F411 Generalized anxiety disorder: Secondary | ICD-10-CM | POA: Diagnosis not present

## 2021-07-30 NOTE — Progress Notes (Signed)
[facility] Counselor/Therapist Progress Note  Patient ID: Autumn Mclaughlin, MRN: [id number],    Date: 07/30/2021  Time Spent: 12:01pm-12:48pm   Treatment Type: Individual Therapy  Pt is seen for a virtual video visit via webex.  Pt joins from work reporting privacy and counselor from her home office.   Reported Symptoms: mild anxiety, mild depressed mood, negative self talk.   Mental Status Exam: Appearance:  Well Groomed     Behavior: Appropriate  Motor: Normal  Speech/Language:  Normal Rate  Affect: Appropriate  Mood: anxious and depressed  Thought process: normal  Thought content:   WNL  Sensory/Perceptual disturbances:   WNL  Orientation: oriented to person, place, time/date, and situation  Attention: Good  Concentration: Good  Memory: WNL  Fund of knowledge:  Good  Insight:   Good  Judgment:  Good  Impulse Control: Good   Risk Assessment: Danger to Self:  No Self-injurious Behavior: No Danger to Others: No Duty to Warn:no Physical Aggression / Violence:No  Access to Firearms a concern: No  Gang Involvement:No   Subjective: Counselor assessed pt current functioning per pt report.  Processed w/pt interactions, mood and stressors.  Explored pt self talk and thoughts- assisted in reframing negative self talk.  Discussed creating time for self care and use of coping skills to assist in managing w/ stressors and mood.  Pt affect wnl. Pt reported some anxiety over past 2 weeks.   Pt discussed that some difficulty w/ self talk with stressors or interactions.  Pt discussed that she has been struggling to find time for self to use coping skills and self care.  Pt discussed prioritizing and that w/ completing training next week will be able to get to more consistent routine.    Interventions: Cognitive Behavioral Therapy and positive self talk  Diagnosis:Generalized anxiety disorder  Major depressive disorder, recurrent episode, moderate (HCC)  Plan: Pt to  f/u in 2 weeks for counseling.  Pt to f/u w/ PCP for medication management as scheduled.   Tx Plan/Goals 1. Develop a consistent, positive self-image. Objective Identify and replace negative self-talk messages used to reinforce low self-esteem. Target Date: 2022-07-16 Frequency: Daily Progress: 0 Modality: individual Related Interventions 1. Help the client identify his/her distorted, negative beliefs about self and the world and replace these messages with more realistic, affirmative messages (or assign "Journal and Replace Self-Defeating Thoughts" in the Adult Psychotherapy Homework Planner by [facility] or read What to Say When You Talk to Yourself by Helmstetter). 2. Enhance ability to effectively cope with the full variety of life's worries and anxieties. Objective Identify, challenge, and replace biased, fearful self-talk with positive, realistic, and empowering self-talk. Target Date: 2022-07-16 Frequency: Daily Progress: 0 Modality: individual Related Interventions 1. Explore the client's schema and self-talk that mediate his/her fear response; assist him/her in challenging the biases; replace the distorted messages with reality-based alternatives and positive, realistic self-talk that will increase his/her self-confidence in coping with irrational fears (see Cognitive Therapy of Anxiety Disorders by Laurence Slate). Objective Learn and implement calming skills to reduce overall anxiety and manage anxiety symptoms. Target Date: 2022-07-16 Frequency: Daily Progress: 0 Modality: individual Related Interventions 1. Teach the client calming/relaxation skills (e.g., applied relaxation, progressive muscle relaxation, cue controlled relaxation; mindful breathing; biofeedback) and how to discriminate better between relaxation and tension; teach the client how to apply these skills to his/her daily life (e.g., New Directions in Progressive Muscle Relaxation by Marcelyn Ditty, and  Hazlett-Stevens; Treating Generalized Anxiety Disorder by Rygh and Ida Rogue).  3. Recognize, accept, and cope with feelings of depression. Objective Learn and implement conflict resolution skills to resolve interpersonal problems. Target Date: 2022-07-16 Frequency: Daily Progress: 0 Modality: individual Related Interventions 1. Teach conflict resolution skills (e.g., empathy, active listening, "I messages," respectful communication, assertiveness without aggression, compromise); use psychoeducation, modeling, role-playing, and rehearsal to work through several current conflicts; assign homework exercises; review and repeat so as to integrate their use into the client's life. Objective Identify and replace thoughts and beliefs that support depression. Target Date: 2022-07-16 Frequency: Daily Progress: 0 Modality: individual Related Interventions 1. Conduct Cognitive-Behavioral Therapy (see Cognitive Behavior Therapy by Reola Calkins; Overcoming Depression by Agapito Games al.), beginning with helping the client learn the connection among cognition, depressive feelings, and actions.   Forde Radon, [facility]

## 2021-08-21 ENCOUNTER — Ambulatory Visit (INDEPENDENT_AMBULATORY_CARE_PROVIDER_SITE_OTHER): Payer: Medicaid Other | Admitting: Psychology

## 2021-08-21 DIAGNOSIS — F411 Generalized anxiety disorder: Secondary | ICD-10-CM

## 2021-08-21 DIAGNOSIS — F331 Major depressive disorder, recurrent, moderate: Secondary | ICD-10-CM | POA: Diagnosis not present

## 2021-08-21 NOTE — Progress Notes (Signed)
[facility] Counselor/Therapist Progress Note  Patient ID: Autumn Mclaughlin, MRN: [id number],    Date: 08/21/2021  Time Spent: 12:03pm-12:54pm   Treatment Type: Individual Therapy  Pt is seen for a virtual video visit via webex.  Pt joins from home and counselor from home office.   Reported Symptoms: mild anxiety, mild depressed mood, negative self talk.   Mental Status Exam: Appearance:  Well Groomed     Behavior: Appropriate  Motor: Normal  Speech/Language:  Normal Rate  Affect: Appropriate  Mood: anxious and depressed  Thought process: normal  Thought content:   WNL  Sensory/Perceptual disturbances:   WNL  Orientation: oriented to person, place, time/date, and situation  Attention: Good  Concentration: Good  Memory: WNL  Fund of knowledge:  Good  Insight:   Good  Judgment:  Good  Impulse Control: Good   Risk Assessment: Danger to Self:  No Self-injurious Behavior: No Danger to Others: No Duty to Warn:no Physical Aggression / Violence:No  Access to Firearms a concern: No  Gang Involvement:No   Subjective: Counselor assessed pt current functioning per pt report.  Processed w/pt transitions w/ work, her mood and self talk.  Discussed use of relaxation/grounding techniques to assist in preparing for work stressors.  Assisted w/ recognizing distortions and negative self talk and reframing.  Pt affect wnl. 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  Pt reported has increased taking her prn med.  Pt receptive to grounding techniques discussed and devoting some time to using her mediation she is familiar w/ prior to going into work. Pt recognized that still learning and is getting good feedback and needs to be patient and encouraging self.  Pt reported that less stress at home recent and positive interactions. Interventions: Cognitive Behavioral Therapy and positive self talk  Diagnosis:Generalized anxiety  disorder  Major depressive disorder, recurrent episode, moderate (HCC)  Plan: Pt to f/u in 2 weeks for counseling.  Pt to f/u w/ PCP for medication management as scheduled.    Tx Plan/Goals 1. Develop a consistent, positive self-image. Objective Identify and replace negative self-talk messages used to reinforce low self-esteem. Target Date: 2022-07-16 Frequency: Daily Progress: 0 Modality: individual Related Interventions 1. Help the client identify his/her distorted, negative beliefs about self and the world and replace these messages with more realistic, affirmative messages (or assign "Journal and Replace Self-Defeating Thoughts" in the Adult Psychotherapy Homework Planner by [facility] or read What to Say When You Talk to Yourself by Helmstetter). 2. Enhance ability to effectively cope with the full variety of life's worries and anxieties. Objective Identify, challenge, and replace biased, fearful self-talk with positive, realistic, and empowering self-talk. Target Date: 2022-07-16 Frequency: Daily Progress: 0 Modality: individual Related Interventions 1. Explore the client's schema and self-talk that mediate his/her fear response; assist him/her in challenging the biases; replace the distorted messages with reality-based alternatives and positive, realistic self-talk that will increase his/her self-confidence in coping with irrational fears (see Cognitive Therapy of Anxiety Disorders by Alison Stalling). Objective Learn and implement calming skills to reduce overall anxiety and manage anxiety symptoms. Target Date: 2022-07-16 Frequency: Daily Progress: 0 Modality: individual Related Interventions 1. Teach the client calming/relaxation skills (e.g., applied relaxation, progressive muscle relaxation, cue controlled relaxation; mindful breathing; biofeedback) and how to discriminate better between relaxation and tension; teach the client how to apply these skills to his/her daily life (e.g.,  New Directions in Progressive Muscle Relaxation by Casper Harrison, and Hazlett-Stevens; Treating  Generalized Anxiety Disorder by Rygh and Amparo Bristol). 3. Recognize, accept, and cope with feelings of depression. Objective Learn and implement conflict resolution skills to resolve interpersonal problems. Target Date: 2022-07-16 Frequency: Daily Progress: 0 Modality: individual Related Interventions 1. Teach conflict resolution skills (e.g., empathy, active listening, "I messages," respectful communication, assertiveness without aggression, compromise); use psychoeducation, modeling, role-playing, and rehearsal to work through several current conflicts; assign homework exercises; review and repeat so as to integrate their use into the client's life. Objective Identify and replace thoughts and beliefs that support depression. Target Date: 2022-07-16 Frequency: Daily Progress: 0 Modality: individual Related Interventions 1. Conduct Cognitive-Behavioral Therapy (see Cognitive Behavior Therapy by Olevia Bowens; Overcoming Depression by Lynita Lombard al.), beginning with helping the client learn the connection among cognition, depressive feelings, and actions.   Jan Fireman, [facility]

## 2021-09-04 ENCOUNTER — Ambulatory Visit (INDEPENDENT_AMBULATORY_CARE_PROVIDER_SITE_OTHER): Payer: Medicaid Other | Admitting: Psychology

## 2021-09-04 DIAGNOSIS — F331 Major depressive disorder, recurrent, moderate: Secondary | ICD-10-CM | POA: Diagnosis not present

## 2021-09-04 DIAGNOSIS — F411 Generalized anxiety disorder: Secondary | ICD-10-CM | POA: Diagnosis not present

## 2021-09-04 NOTE — Progress Notes (Signed)
[facility] Counselor/Therapist Progress Note  Patient ID: Autumn Mclaughlin, MRN: [id number],    Date: 09/04/2021  Time Spent: 12:07pm-12:55pm   Treatment Type: Individual Therapy  Pt is seen for a virtual video visit via webex.  Pt joins from home and counselor from home office.   Reported Symptoms:  anxiety,  negative self talk.   Mental Status Exam: Appearance:  Well Groomed     Behavior: Appropriate  Motor: Normal  Speech/Language:  Normal Rate  Affect: Appropriate  Mood: anxious  Thought process: normal  Thought content:   WNL  Sensory/Perceptual disturbances:   WNL  Orientation: oriented to person, place, time/date, and situation  Attention: Good  Concentration: Good  Memory: WNL  Fund of knowledge:  Good  Insight:   Good  Judgment:  Good  Impulse Control: Good   Risk Assessment: Danger to Self:  No Self-injurious Behavior: No Danger to Others: No Duty to Warn:no Physical Aggression / Violence:No  Access to Firearms a concern: No  Gang Involvement:No   Subjective: Counselor assessed pt current functioning per pt report.  Processed w/pt stressors and coping skills using.  Assisted pt w/ recognizing barriers this week towards self care and being intentional.  Assisted w/ pt recognizing and reframing negative self talk and identifying positives.   Pt affect wnl. Pt reported stress at work w/ interactions, self doubt.  Pt identifying that w/ tooth pain added barriers to being intentional about self care and coping skills for managing stress.   Pt was able to identifying negative self talk and reframe.  Pt also reflected positives and things she is doing well and that are positives in her life currently.  Interventions: Cognitive Behavioral Therapy and positive self talk  Diagnosis:Generalized anxiety disorder  Major depressive disorder, recurrent episode, moderate (HCC)  Plan: Pt to f/u in 2 weeks for counseling.  Pt to f/u w/ PCP for medication  management as scheduled.    Tx Plan/Goals 1. Develop a consistent, positive self-image. Objective Identify and replace negative self-talk messages used to reinforce low self-esteem. Target Date: 2022-07-16 Frequency: Daily Progress: 0 Modality: individual Related Interventions 1. Help the client identify his/her distorted, negative beliefs about self and the world and replace these messages with more realistic, affirmative messages (or assign "Journal and Replace Self-Defeating Thoughts" in the Adult Psychotherapy Homework Planner by [facility] or read What to Say When You Talk to Yourself by Helmstetter). 2. Enhance ability to effectively cope with the full variety of life's worries and anxieties. Objective Identify, challenge, and replace biased, fearful self-talk with positive, realistic, and empowering self-talk. Target Date: 2022-07-16 Frequency: Daily Progress: 0 Modality: individual Related Interventions 1. Explore the client's schema and self-talk that mediate his/her fear response; assist him/her in challenging the biases; replace the distorted messages with reality-based alternatives and positive, realistic self-talk that will increase his/her self-confidence in coping with irrational fears (see Cognitive Therapy of Anxiety Disorders by Laurence Slatelark and Beck). Objective Learn and implement calming skills to reduce overall anxiety and manage anxiety symptoms. Target Date: 2022-07-16 Frequency: Daily Progress: 0 Modality: individual Related Interventions 1. Teach the client calming/relaxation skills (e.g., applied relaxation, progressive muscle relaxation, cue controlled relaxation; mindful breathing; biofeedback) and how to discriminate better between relaxation and tension; teach the client how to apply these skills to his/her daily life (e.g., New Directions in Progressive Muscle Relaxation by Marcelyn DittyBernstein, Borkovec, and Hazlett-Stevens; Treating Generalized Anxiety Disorder by Rygh and  Ida RogueSanderson). 3. Recognize, accept, and cope with feelings of depression. Objective Learn and implement  conflict resolution skills to resolve interpersonal problems. Target Date: 2022-07-16 Frequency: Daily Progress: 0 Modality: individual Related Interventions 1. Teach conflict resolution skills (e.g., empathy, active listening, "I messages," respectful communication, assertiveness without aggression, compromise); use psychoeducation, modeling, role-playing, and rehearsal to work through several current conflicts; assign homework exercises; review and repeat so as to integrate their use into the client's life. Objective Identify and replace thoughts and beliefs that support depression. Target Date: 2022-07-16 Frequency: Daily Progress: 0 Modality: individual Related Interventions 1. Conduct Cognitive-Behavioral Therapy (see Cognitive Behavior Therapy by Olevia Bowens; Overcoming Depression by Lynita Lombard al.), beginning with helping the client learn the connection among cognition, depressive feelings, and actions.   Jan Fireman [facility]                  [place], [facility]

## 2021-09-17 ENCOUNTER — Ambulatory Visit: Payer: Medicaid Other | Admitting: Psychology

## 2022-01-13 ENCOUNTER — Ambulatory Visit
Admission: EM | Admit: 2022-01-13 | Discharge: 2022-01-13 | Disposition: A | Discharge status: Home / Self Care | Payer: Medicaid Other | Attending: Emergency Medicine | Admitting: Emergency Medicine

## 2022-01-13 ENCOUNTER — Other Ambulatory Visit: Payer: Self-pay

## 2022-01-13 ENCOUNTER — Encounter: Payer: Self-pay | Admitting: Emergency Medicine

## 2022-01-13 DIAGNOSIS — G5621 Lesion of ulnar nerve, right upper limb: Secondary | ICD-10-CM

## 2022-01-13 DIAGNOSIS — G629 Polyneuropathy, unspecified: Secondary | ICD-10-CM | POA: Diagnosis not present

## 2022-01-13 DIAGNOSIS — G540 Brachial plexus disorders: Secondary | ICD-10-CM

## 2022-01-13 MED ORDER — METHYLPREDNISOLONE 4 MG PO TBPK: ORAL_TABLET | ORAL | 0 refills | Status: AC

## 2022-01-13 MED ORDER — TRIAMCINOLONE ACETONIDE 40 MG/ML IJ SUSP
60.0000 mg | Freq: Once | INTRAMUSCULAR | Status: AC
  Administered 2022-01-13: 60 mg via INTRAMUSCULAR

## 2022-01-13 NOTE — ED Provider Notes (Signed)
[facility]    CSN: [id number] Arrival date & time: 01/13/22  0946    HISTORY   Chief Complaint  Patient presents with   Arm Pain   HPI Autumn Mclaughlin is a 41 y.o. female. Pt sts right wrist numbness and tingling with pain into right arm after falling asleep while riding in an airplane 2 days ago.  Patient denies prior injury to her left shoulder, arm, elbow, wrist.  Patient states at this time she is able to bend her fingers and her hand towards her arm but is not able to bend them away from her arm.  Patient states she has never experienced anything like this in the past.  Patient states she has some sensation in her fingers and the tingling sensation has largely resolved.   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  Patient endorses playing video games in her spare time.  The history is provided by the patient.  Past Medical History:  Diagnosis Date   H/O cone biopsy of cervix    Hypothyroidism    Preterm labor    Patient Active Problem List   Diagnosis Date Noted   Status post primary low transverse cesarean section 02/05/2015   Preterm premature rupture of membranes (PPROM) with unknown onset of labor 01/26/2015   Past Surgical History:  Procedure Laterality Date   CESAREAN SECTION N/A 02/02/2015   Procedure: CESAREAN SECTION;  Surgeon: Myna Hidalgo, DO;  Location: [facility];  Service: Obstetrics;  Laterality: N/A;   EYE SURGERY     GASTRIC BYPASS     LAPAROSCOPIC BILATERAL SALPINGECTOMY Bilateral 07/11/2021   Procedure: LAPAROSCOPIC BILATERAL SALPINGECTOMY;  Surgeon: Steva Ready, DO;  Location: [facility];  Service: Gynecology;  Laterality: Bilateral;   OB History     Gravida  4   Para  2   Term      Preterm  2   AB  2   Living  2      SAB  2   IAB      Ectopic      Multiple  0   Live Births  2          Home Medications    Prior to Admission medications   Medication Sig  Start Date End Date Taking? Authorizing Provider  clonazePAM (KLONOPIN PO) Take by mouth.    [provider]  levothyroxine (SYNTHROID) 112 MCG tablet Take 112 mcg by mouth daily. 08/19/19   [provider]  methocarbamol (ROBAXIN) 500 MG tablet Take 1 tablet (500 mg total) by mouth 2 (two) times daily. Patient not taking: Reported on 12/22/2020 08/16/20   Tanda Rockers, PA-C  Multiple Vitamins-Minerals (MULTIVITAMIN ADULTS PO) Take 1 tablet by mouth daily.    [provider]  oxyCODONE (ROXICODONE) 5 MG immediate release tablet Take 2 tablets (10 mg total) by mouth every 6 (six) hours as needed for severe pain or breakthrough pain. 07/11/21   Steva Ready, DO  oxyCODONE-acetaminophen (PERCOCET/ROXICET) 5-325 MG tablet Take by mouth every 6 (six) hours as needed for severe pain.    [provider]    Family History Family History  Problem Relation Age of Onset   Diabetes Maternal Grandfather    Asthma Sister    Cancer Sister    Cancer Maternal Grandmother    Cancer Paternal Grandmother    Thyroid disease Mother    Cancer Father    Social History Social History  Tobacco Use   Smoking status: Every Day    Packs/day: 1.00    Types: Cigarettes    Last attempt to quit: 08/19/2014    Years since quitting: 7.4   Smokeless tobacco: Never  Vaping Use   Vaping Use: Never used  Substance Use Topics   Alcohol use: No   Drug use: No   Allergies   Nsaids, Sulfa antibiotics, and Flexeril [cyclobenzaprine]  Review of Systems Review of Systems Pertinent findings noted in history of present illness.   Physical Exam Triage Vital Signs ED Triage Vitals  Enc Vitals Group     BP 06/15/21 0827 (!) 147/82     Pulse Rate 06/15/21 0827 72     Resp 06/15/21 0827 18     Temp 06/15/21 0827 98.3 F (36.8 C)     Temp Source 06/15/21 0827 Oral     SpO2 06/15/21 0827 98 %     Weight --      Height --      Head Circumference --      Peak Flow --       Pain Score 06/15/21 0826 5     Pain Loc --      Pain Edu? --      Excl. in GC? --   No data found.  Updated Vital Signs BP 103/68 (BP Location: Left Arm)   Pulse 78   Temp 98.7 F (37.1 C) (Oral)   Resp 18   SpO2 97%   Physical Exam Musculoskeletal:     Right shoulder: Normal.     Left shoulder: Normal.     Right upper arm: Tenderness (Mild TTP deltoid muscle) present. No swelling, edema, deformity or lacerations.     Left upper arm: Normal.     Right elbow: No swelling, deformity, effusion or lacerations. Normal range of motion. No tenderness (No bony tenderness appreciated).     Left elbow: Normal.     Right forearm: Tenderness (Mild TTP flexor carpi ulnaris) present. No swelling, edema, deformity, lacerations or bony tenderness.     Left forearm: Normal.     Right wrist: No swelling, deformity, effusion, lacerations, tenderness, bony tenderness, snuff box tenderness or crepitus. Decreased range of motion (Wrist can be passively flexed without pain, patient unable to actively flex). Normal pulse.     Left wrist: Normal.     Right hand: No swelling, deformity, lacerations, tenderness or bony tenderness. Decreased range of motion (Patient unable to actively extend third fourth and fifth fingers, decreased strength with extension of first finger.  Normal active motion and strength of thumb.). Decreased sensation of the ulnar distribution and median distribution. Normal sensation of the radial distribution. There is disruption of two-point discrimination. Normal capillary refill. Normal pulse.     Left hand: Normal.    Visual Acuity Right Eye Distance:   Left Eye Distance:   Bilateral Distance:    Right Eye Near:   Left Eye Near:    Bilateral Near:     [facility] Couse / Diagnostics / Procedures:    EKG  Radiology No results found.  Procedures Procedures (including critical care time)  UC Diagnoses / Final Clinical Impressions(s)   I have reviewed the triage vital signs and  the nursing notes.  Pertinent labs & imaging results that were available during my care of the patient were reviewed by me and considered in my medical decision making (see chart for details).    Final diagnoses:  Neuropathy  Ulnar neuropathy at wrist,  right   Patient advised that I am uncertain whether her falling asleep in the ear pain is the cause of her numbness and decreased range of motion with extension of her right wrist and fingers.  Patient provided with an injection of Kenalog 60 mg during her visit today and advised to begin a tapering dose of methylprednisolone. Patient advised she likely needs to follow-up with primary care, possibly neurology if this does not improve with an aggressive course of steroids.  Patient verbalized understanding.  ED Prescriptions     Medication Sig Dispense Auth. Provider   methylPREDNISolone (MEDROL DOSEPAK) 4 MG TBPK tablet Take 24 mg on day 1, 20 mg on day 2, 16 mg on day 3, 12 mg on day 4, 8 mg on day 5, 4 mg on day 6.  Take all tablets in each row at once, do not spread tablets out throughout the day. 21 tablet Theadora RamaMorgan, Taiylor Virden Scales, PA-C      PDMP not reviewed this encounter.  Pending results:  Labs Reviewed - No data to display  Medications Ordered in UC: Medications  triamcinolone acetonide (KENALOG-40) injection 60 mg (60 mg Intramuscular Given 01/13/22 1113)    Disposition Upon Discharge:  Condition: stable for discharge home Home: take medications as prescribed; routine discharge instructions as discussed; follow up as advised.  Patient presented with an acute illness with associated systemic symptoms and significant discomfort requiring urgent management. In my opinion, this is a condition that a prudent lay person (someone who possesses an average knowledge of health and medicine) may potentially expect to result in complications if not addressed urgently such as respiratory distress, impairment of bodily function or dysfunction  of bodily organs.   Routine symptom specific, illness specific and/or disease specific instructions were discussed with the patient and/or caregiver at length.   As such, the patient has been evaluated and assessed, work-up was performed and treatment was provided in alignment with urgent care protocols and evidence based medicine.  Patient/parent/caregiver has been advised that the patient may require follow up for further testing and treatment if the symptoms continue in spite of treatment, as clinically indicated and appropriate.  Patient/parent/caregiver has been advised [facility] if no better; to PCP or the Emergency Department if new signs and symptoms develop, or if the current signs or symptoms continue to change or worsen for further workup, evaluation and treatment as clinically indicated and appropriate  The patient will follow up with their current PCP if and as advised. If the patient does not currently have a PCP we will assist them in obtaining one.   The patient may need specialty follow up if the symptoms continue, in spite of conservative treatment and management, for further workup, evaluation, consultation and treatment as clinically indicated and appropriate.   Patient/parent/caregiver verbalized understanding and agreement of plan as discussed.  All questions were addressed during visit.  Please see discharge instructions below for further details of plan.  Discharge Instructions:   Discharge Instructions      To reduce any inflammation that is causing the nerve weakness in your right arm, you are provided with an injection of Kenalog 60 mg during her visit today.  I also provided you with a prescription for methylprednisolone that you should take over the next 6 days if this Kenalog injection initially provided you with some improvement.  Regardless of your response to steroids at this time, I recommend that you follow-up with [facility], they  have  an urgent walk-in clinic does not require you to schedule appointment and does not require referral.  I provided their address for you in this checkout sheet.  Thank you for visiting urgent care today.      This office note has been dictated using Teaching laboratory technician.  Unfortunately, and despite my best efforts, this method of dictation can sometimes lead to occasional typographical or grammatical errors.  I apologize in advance if this occurs.     Theadora Rama Scales, [place] 01/14/22 [phone]

## 2022-01-13 NOTE — Discharge Instructions (Signed)
To reduce any inflammation that is causing the nerve weakness in your right arm, you are provided with an injection of Kenalog 60 mg during her visit today.  I also provided you with a prescription for methylprednisolone that you should take over the next 6 days if this Kenalog injection initially provided you with some improvement.  Regardless of your response to steroids at this time, I recommend that you follow-up with [facility], they have an urgent walk-in clinic does not require you to schedule appointment and does not require referral.  I provided their address for you in this checkout sheet.  Thank you for visiting urgent care today.

## 2022-01-13 NOTE — ED Triage Notes (Signed)
Pt sts right wrist numbness and tingling with pain into right arm after sleeping in plane on Friday; denies other injury

## 2022-01-23 ENCOUNTER — Ambulatory Visit: Payer: Medicaid Other | Admitting: Psychology
